# Patient Record
Sex: Male | Born: 1975 | Race: Black or African American | Hispanic: No | Smoking: Current every day smoker
Health system: Southern US, Community
[De-identification: ages and names within clinical notes are randomized; demographics above are authoritative.]

## PROBLEM LIST (undated history)

## (undated) ENCOUNTER — Emergency Department (HOSPITAL_COMMUNITY): Admission: EM | Source: Home / Self Care

## (undated) DIAGNOSIS — F329 Major depressive disorder, single episode, unspecified: Secondary | ICD-10-CM

## (undated) DIAGNOSIS — G4730 Sleep apnea, unspecified: Secondary | ICD-10-CM

## (undated) DIAGNOSIS — E119 Type 2 diabetes mellitus without complications: Secondary | ICD-10-CM

## (undated) DIAGNOSIS — I4891 Unspecified atrial fibrillation: Principal | ICD-10-CM

## (undated) DIAGNOSIS — F32A Depression, unspecified: Secondary | ICD-10-CM

## (undated) DIAGNOSIS — I10 Essential (primary) hypertension: Secondary | ICD-10-CM

## (undated) DIAGNOSIS — F319 Bipolar disorder, unspecified: Secondary | ICD-10-CM

## (undated) HISTORY — PX: CARDIOVERSION: SHX1299

---

## 2012-09-15 ENCOUNTER — Inpatient Hospital Stay (HOSPITAL_COMMUNITY)
Admission: AD | Admit: 2012-09-15 | Discharge: 2012-09-18 | DRG: 885 | Disposition: A | Discharge status: Home / Self Care | Payer: 59 | Source: Intra-hospital | Attending: Psychiatry | Admitting: Psychiatry

## 2012-09-15 ENCOUNTER — Encounter (HOSPITAL_COMMUNITY): Payer: Self-pay | Admitting: *Deleted

## 2012-09-15 ENCOUNTER — Emergency Department (HOSPITAL_COMMUNITY)
Admission: EM | Admit: 2012-09-15 | Discharge: 2012-09-15 | Disposition: A | Discharge status: Other Acute Inpatient Hospital | Payer: Self-pay | Attending: Emergency Medicine | Admitting: Emergency Medicine

## 2012-09-15 ENCOUNTER — Encounter (HOSPITAL_COMMUNITY): Payer: Self-pay

## 2012-09-15 DIAGNOSIS — F1910 Other psychoactive substance abuse, uncomplicated: Secondary | ICD-10-CM

## 2012-09-15 DIAGNOSIS — F329 Major depressive disorder, single episode, unspecified: Secondary | ICD-10-CM

## 2012-09-15 DIAGNOSIS — I10 Essential (primary) hypertension: Secondary | ICD-10-CM

## 2012-09-15 DIAGNOSIS — F319 Bipolar disorder, unspecified: Principal | ICD-10-CM | POA: Diagnosis present

## 2012-09-15 DIAGNOSIS — R45851 Suicidal ideations: Secondary | ICD-10-CM | POA: Insufficient documentation

## 2012-09-15 DIAGNOSIS — Z79899 Other long term (current) drug therapy: Secondary | ICD-10-CM

## 2012-09-15 DIAGNOSIS — F1010 Alcohol abuse, uncomplicated: Secondary | ICD-10-CM | POA: Insufficient documentation

## 2012-09-15 HISTORY — DX: Bipolar disorder, unspecified: F31.9

## 2012-09-15 HISTORY — DX: Depression, unspecified: F32.A

## 2012-09-15 HISTORY — DX: Major depressive disorder, single episode, unspecified: F32.9

## 2012-09-15 HISTORY — DX: Essential (primary) hypertension: I10

## 2012-09-15 LAB — BASIC METABOLIC PANEL
CO2: 29 mEq/L (ref 19–32)
Calcium: 9.1 mg/dL (ref 8.4–10.5)
Creatinine, Ser: 1.85 mg/dL — ABNORMAL HIGH (ref 0.50–1.35)
Glucose, Bld: 195 mg/dL — ABNORMAL HIGH (ref 70–99)

## 2012-09-15 LAB — CBC WITH DIFFERENTIAL/PLATELET
Basophils Absolute: 0 10*3/uL (ref 0.0–0.1)
Basophils Relative: 0 % (ref 0–1)
Eosinophils Absolute: 0.2 10*3/uL (ref 0.0–0.7)
Eosinophils Relative: 2 % (ref 0–5)
MCH: 29.1 pg (ref 26.0–34.0)
MCHC: 34.3 g/dL (ref 30.0–36.0)
MCV: 84.8 fL (ref 78.0–100.0)
Platelets: 298 10*3/uL (ref 150–400)
RDW: 13.4 % (ref 11.5–15.5)

## 2012-09-15 LAB — RAPID URINE DRUG SCREEN, HOSP PERFORMED: Amphetamines: NOT DETECTED

## 2012-09-15 LAB — GLUCOSE, CAPILLARY: Glucose-Capillary: 140 mg/dL — ABNORMAL HIGH (ref 70–99)

## 2012-09-15 LAB — ETHANOL: Alcohol, Ethyl (B): <11 mg/dL (ref 0–11)

## 2012-09-15 MED ORDER — ACETAMINOPHEN 325 MG PO TABS
650.0000 mg | ORAL_TABLET | Freq: Four times a day (QID) | ORAL | Status: DC | PRN
  Administered 2012-09-16 (×2): 650 mg via ORAL

## 2012-09-15 MED ORDER — FAMOTIDINE 20 MG PO TABS
20.0000 mg | ORAL_TABLET | Freq: Two times a day (BID) | ORAL | Status: DC
  Administered 2012-09-15 – 2012-09-18 (×6): 20 mg via ORAL
  Filled 2012-09-15 (×11): qty 1

## 2012-09-15 MED ORDER — TRIAMTERENE-HCTZ 37.5-25 MG PO TABS
1.0000 | ORAL_TABLET | Freq: Every day | ORAL | Status: DC
  Administered 2012-09-15 – 2012-09-18 (×4): 1 via ORAL
  Filled 2012-09-15 (×5): qty 1
  Filled 2012-09-15: qty 3
  Filled 2012-09-15: qty 1

## 2012-09-15 MED ORDER — ASPIRIN EC 81 MG PO TBEC
81.0000 mg | DELAYED_RELEASE_TABLET | Freq: Every day | ORAL | Status: DC
  Administered 2012-09-16 – 2012-09-18 (×3): 81 mg via ORAL
  Filled 2012-09-15 (×5): qty 1

## 2012-09-15 MED ORDER — TRAZODONE HCL 50 MG PO TABS
50.0000 mg | ORAL_TABLET | Freq: Every evening | ORAL | Status: DC | PRN
  Administered 2012-09-15 – 2012-09-17 (×4): 50 mg via ORAL
  Filled 2012-09-15 (×3): qty 1
  Filled 2012-09-15: qty 28
  Filled 2012-09-15 (×3): qty 1
  Filled 2012-09-15: qty 28
  Filled 2012-09-15 (×4): qty 1

## 2012-09-15 MED ORDER — POTASSIUM CHLORIDE CRYS ER 20 MEQ PO TBCR
40.0000 meq | EXTENDED_RELEASE_TABLET | Freq: Once | ORAL | Status: AC
  Administered 2012-09-15: 40 meq via ORAL
  Filled 2012-09-15 (×2): qty 2

## 2012-09-15 MED ORDER — CLONIDINE HCL 0.1 MG PO TABS: 0.1000 mg | ORAL_TABLET | Freq: Once | ORAL | Status: AC

## 2012-09-15 MED ORDER — CLONIDINE HCL 0.1 MG PO TABS
0.1000 mg | ORAL_TABLET | Freq: Three times a day (TID) | ORAL | Status: DC
  Administered 2012-09-16 – 2012-09-18 (×8): 0.1 mg via ORAL
  Filled 2012-09-15 (×6): qty 1
  Filled 2012-09-15: qty 42
  Filled 2012-09-15: qty 1
  Filled 2012-09-15 (×2): qty 42
  Filled 2012-09-15 (×3): qty 1

## 2012-09-15 MED ORDER — POTASSIUM CHLORIDE CRYS ER 20 MEQ PO TBCR
20.0000 meq | EXTENDED_RELEASE_TABLET | Freq: Two times a day (BID) | ORAL | Status: AC
  Administered 2012-09-15 – 2012-09-17 (×4): 20 meq via ORAL
  Filled 2012-09-15 (×5): qty 1

## 2012-09-15 MED ORDER — MAGNESIUM HYDROXIDE 400 MG/5ML PO SUSP: 30.0000 mL | Freq: Every day | ORAL | Status: DC | PRN

## 2012-09-15 MED ORDER — CLONIDINE HCL 0.1 MG PO TABS
ORAL_TABLET | ORAL | Status: AC
  Administered 2012-09-15: 0.1 mg via ORAL
  Filled 2012-09-15: qty 1

## 2012-09-15 MED ORDER — ALUM & MAG HYDROXIDE-SIMETH 200-200-20 MG/5ML PO SUSP: 30.0000 mL | ORAL | Status: DC | PRN

## 2012-09-15 NOTE — ED Notes (Signed)
Per Altmar Sink from [facility] - pt has been accepted to [facility] by Dr. Elsie Saas, dx Mood disorder.  Rm # [id number].  Edp notified.

## 2012-09-15 NOTE — ED Notes (Signed)
Called for Telepsych consult Appt.  Time is 1515.  Nurse informed.

## 2012-09-15 NOTE — ED Provider Notes (Signed)
CSN: [id number]     Arrival date & time 09/15/12  4098 History  This chart was scribed for Donnetta Hutching, MD, by Yevette Edwards, ED Scribe. This patient was seen in room APA16A/APA16A and the patient's care was started at 11:06 AM.   First MD Initiated Contact with Patient 09/15/12 0950     Chief Complaint  Patient presents with  . V70.1   (Consider location/radiation/quality/duration/timing/severity/associated sxs/prior Treatment) The history is provided by the patient. No language interpreter was used.   HPI Comments: Christopher Chambers is a 37 y.o. male, with a h/o bipolar disorder and depression, who presents to the Emergency Department complaining of suicidal ideations which have persisted for several days. He states, "I want to kill myself," and he reports having a suicide plan of taking pills. He states that "he does not feel safe by himself." Yesterday, the pt used cocaine, pain pills, and alcohol. The pt has a h/o suicide ideations, and he stopped taking his medication three weeks ago because he did not like how the medication made him feel. He has one prior hospitalization in [place] for depression.   The pt uses [facility] for his mental health care. His last appointment was several weeks ago.  The pt is currently unemployed.  No past medical history on file. No past surgical history on file. No family history on file. History  Substance Use Topics  . Smoking status: Not on file  . Smokeless tobacco: Not on file  . Alcohol Use: Not on file    Review of Systems  Constitutional: Negative for fever and chills.  Psychiatric/Behavioral: Positive for suicidal ideas.  All other systems reviewed and are negative.    Allergies  Review of patient's allergies indicates not on file.  Home Medications  No current outpatient prescriptions on file.  Triage Vitals: Pulse 79  Temp(Src) 98.2 F (36.8 C)  Resp 18  Ht 6\' 2"  (1.88 m)  Wt 350 lb (158.759 kg)  BMI 44.92 kg/m2  SpO2  98%  Physical Exam  Nursing note and vitals reviewed. Constitutional: He is oriented to person, place, and time. He appears well-developed and well-nourished. No distress.  HENT:  Head: Normocephalic and atraumatic.  Eyes: EOM are normal.  Neck: Neck supple. No tracheal deviation present.  Cardiovascular: Normal rate.   Pulmonary/Chest: Effort normal and breath sounds normal. No respiratory distress.  Musculoskeletal: Normal range of motion.  Neurological: He is oriented to person, place, and time.  Skin: Skin is warm and dry.  Psychiatric:  Flat affect. Depressed.     ED Course   DIAGNOSTIC STUDIES:  Oxygen Saturation is 98% on room air, normal by my interpretation.    COORDINATION OF CARE:  9:56 AM- Discussed treatment plan with patient, and the patient agreed to the plan.   Procedures (including critical care time)  Labs Reviewed  BASIC METABOLIC PANEL - Abnormal; Notable for the following:    Potassium 2.7 (*)    Glucose, Bld 195 (*)    Creatinine, Ser 1.85 (*)    GFR calc non Af Amer 45 (*)    GFR calc Af Amer 52 (*)    All other components within normal limits  CBC WITH DIFFERENTIAL - Abnormal; Notable for the following:    WBC 11.3 (*)    Neutro Abs 8.4 (*)    All other components within normal limits  URINE RAPID DRUG SCREEN (HOSP PERFORMED) - Abnormal; Notable for the following:    Opiates POSITIVE (*)    Cocaine POSITIVE (*)  Tetrahydrocannabinol POSITIVE (*)    All other components within normal limits  ETHANOL   No results found. No diagnosis found.  MDM  Patient has depression with suicidal ruminations.  Drug screen positive for opiates, cocaine, THC.    Behavioral health consultation.  I personally performed the services described in this documentation, which was scribed in my presence. The recorded information has been reviewed and is accurate.    Donnetta Hutching, MD 09/15/12 1434

## 2012-09-15 NOTE — Progress Notes (Signed)
Patient in bed during this assessment. Mood and affect appropriate. He appeared sad and depressed. Although he denied SI/HI and denied hallucinations. Writer encouraged and supported patient. Notified AC concerning patient's weight and asked him to order bariatric bed for patient. AC Eric to call and place an order for Bariatric bed to be brought in tomorrow since patient came in after business hour. Q 15 minute check continues as ordered to maintain safety.
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
## 2012-09-15 NOTE — ED Notes (Addendum)
[facility] AC notified of pt blood pressure upon transport requesting medication for BP prior to transport.m EDP notified and orders received. Receiving RN notified as well.

## 2012-09-15 NOTE — Tx Team (Signed)
Initial Interdisciplinary Treatment Plan  PATIENT STRENGTHS: (choose at least two) General fund of knowledge  PATIENT STRESSORS: Legal issue Substance abuse   PROBLEM LIST: Problem List/Patient Goals Date to be addressed Date deferred Reason deferred Estimated date of resolution  Depression 09/15/12     Risk Suicide 09/15/12     SI 09/15/12     Medication Non-compliance 09/15/12                                    DISCHARGE CRITERIA:  Improved stabilization in mood, thinking, and/or behavior Verbal commitment to aftercare and medication compliance  PRELIMINARY DISCHARGE PLAN: Attend aftercare/continuing care group Attend PHP/IOP  PATIENT/FAMIILY INVOLVEMENT: This treatment plan has been presented to and reviewed with the patient, Christopher Chambers.  The patient and family have been given the opportunity to ask questions and make suggestions.  Jacques Navy A 09/15/2012, 8:22 PM

## 2012-09-15 NOTE — ED Notes (Signed)
Called [facility] for  Transport to [facility].  Spoke with Maisie Fus.  "They'll send a truck when one is available".  Pts nurse informed.

## 2012-09-15 NOTE — ED Notes (Signed)
CRITICAL VALUE ALERT  Critical value received:  K 2.7  Date of notification:  09/15/2012  Time of notification:  1149  Critical value read back:yes  Nurse who received alert:  [facility]  MD notified (1st page):  Dr. Adriana Simas  Time of first page:  1149  MD notified (2nd page):  Time of second page:  Responding MD:  Dr. Adriana Simas Time MD responded: 1149
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
## 2012-09-15 NOTE — ED Notes (Signed)
Pt presents to er with request for help for SI thoughts, pt state "I want to kill myself" admits to having a plan of taking pills, reports that he stopped taking his medication three weeks ago because he did not like the way it made him feel.

## 2012-09-16 DIAGNOSIS — F1994 Other psychoactive substance use, unspecified with psychoactive substance-induced mood disorder: Secondary | ICD-10-CM

## 2012-09-16 DIAGNOSIS — F1920 Other psychoactive substance dependence, uncomplicated: Secondary | ICD-10-CM

## 2012-09-16 DIAGNOSIS — I10 Essential (primary) hypertension: Secondary | ICD-10-CM | POA: Insufficient documentation

## 2012-09-16 DIAGNOSIS — F1910 Other psychoactive substance abuse, uncomplicated: Secondary | ICD-10-CM

## 2012-09-16 DIAGNOSIS — F319 Bipolar disorder, unspecified: Secondary | ICD-10-CM | POA: Diagnosis present

## 2012-09-16 DIAGNOSIS — F3130 Bipolar disorder, current episode depressed, mild or moderate severity, unspecified: Secondary | ICD-10-CM

## 2012-09-16 DIAGNOSIS — F329 Major depressive disorder, single episode, unspecified: Secondary | ICD-10-CM | POA: Insufficient documentation

## 2012-09-16 LAB — BASIC METABOLIC PANEL
CO2: 27 mEq/L (ref 19–32)
Calcium: 8.9 mg/dL (ref 8.4–10.5)
Creatinine, Ser: 1.71 mg/dL — ABNORMAL HIGH (ref 0.50–1.35)
Glucose, Bld: 137 mg/dL — ABNORMAL HIGH (ref 70–99)

## 2012-09-16 LAB — LIPID PANEL
HDL: 31 mg/dL — ABNORMAL LOW (ref >39)
Total CHOL/HDL Ratio: 4.5 RATIO

## 2012-09-16 LAB — TSH: TSH: 0.928 u[IU]/mL (ref 0.350–4.500)
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
## 2012-09-16 NOTE — Progress Notes (Signed)
D:Pt interacting on the unit with peers. Pt c/o headache today and was given prn tylenol. A:Offered support, encouragement and 15 minute checks. R:Pt denies si and hi. Pt denies hallucinations. Safety maintained on the unit.

## 2012-09-16 NOTE — Progress Notes (Signed)
Adult Psychoeducational Group Note  Date:  09/16/2012 Time:  1:58 PM  Group Topic/Focus:  Crisis Planning:   The purpose of this group is to help patients create a crisis plan for use upon discharge or in the future, as needed.  Participation Level:  Did Not Attend  Additional Comments:  Pt refused to attend group.   Dalia Heading 09/16/2012, 1:58 PM

## 2012-09-16 NOTE — Progress Notes (Signed)
D: Patient in the dayroom interacting with peers.  Patient states he is ready to go home.  Patient complained of a mild headache today but states it had decreased.  Patient animated and interacting with peers.  Patient states he is ready to be discharged because patient states he has things he needs to to on the weekend.  Patient states he had stopped taking his medications for weeks and states he is concerned that he needs to be taking the medications he was prescribed from [facility].   Patient denies SI/HI and denies AVH.   A: Staff to monitor Q 15 mins for safety.  Encouragement and support offered.  Scheduled medications administered per orders.   R: Patient remains safe on the unit.  Patient did not attend group tonight but was in the dayroom before group and left before it started and came back for snack time.  Patient visible on the unit and interacting with peers.
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
## 2012-09-16 NOTE — Progress Notes (Signed)
Adult Psychoeducational Group Note  Date:  09/16/2012 Time:  10:00AM Group Topic/Focus:  Therapeutic Activity  Participation Level:  Did Not Attend    Additional Comments: Pt. Didn't attend group.   Bing Plume D 09/16/2012, 10:45 AM

## 2012-09-16 NOTE — Progress Notes (Signed)
Recreation Therapy Notes  Date: 08.13.2014 Time: 3:00pm Location: [address] Dayroom  Group Topic: Communication, Team Building, Problem Solving  Goal Area(s) Addresses:  Patient will be able to recognize use of communication, team building and problem solving during course of group activity. Patient will verbalize need for communication, team building and problem solving to make group activity successful.  Patient will verbalize benefit of communication, team building and problem solving to post d/c goals.   Behavioral Response: Did not attend  Jearl Klinefelter, LRT/CTRS  Jearl Klinefelter 09/16/2012 4:57 PM

## 2012-09-16 NOTE — [facility] Group Notes (Signed)
[facility] LCSW Aftercare Discharge Planning Group Note   09/16/2012 8:45 AM  Participation Quality:  Did Not Attend  [name], LCSWA 09/16/2012 11:41 AM   

## 2012-09-16 NOTE — [facility] Group Notes (Signed)
[facility] LCSW Group Therapy  09/16/2012  1:15 PM   Type of Therapy:  Group Therapy  Participation Level:  Active  Participation Quality:  Appropriate and Attentive  Affect:  Appropriate, Depressed and Blunted  Cognitive:  Alert and Appropriate  Insight:  Developing/Improving and Engaged  Engagement in Therapy:  Developing/Improving and Engaged  Modes of Intervention:  Clarification, Confrontation, Discussion, Education, Exploration, Limit-setting, Orientation, Problem-solving, Rapport Building, Dance movement psychotherapist, Socialization and Support  Summary of Progress/Problems: The topic for group today was emotional regulation.  This group focused on both positive and negative emotion identification and allowed group members to process ways to identify feelings, regulate negative emotions, and find healthy ways to manage internal/external emotions. Group members were asked to reflect on a time when their reaction to an emotion led to a negative outcome and explored how alternative responses using emotion regulation would have benefited them. Group members were also asked to discuss a time when emotion regulation was utilized when a negative emotion was experienced. Pt did not participate in group discussion and would stare at other pts.  Pt reports having a headache and would listen to group today.   Reyes Ivan, LCSWA 09/16/2012 2:42 PM

## 2012-09-16 NOTE — Tx Team (Signed)
Interdisciplinary Treatment Plan Update (Adult)  Date: 09/16/2012  Time Reviewed:  9:45 AM  Progress in Treatment: Attending groups: Yes Participating in groups:  Yes Taking medication as prescribed:  Yes Tolerating medication:  Yes Family/Significant othe contact made: CSW assessing  Patient understands diagnosis:  Yes Discussing patient identified problems/goals with staff:  Yes Medical problems stabilized or resolved:  Yes Denies suicidal/homicidal ideation: Yes Issues/concerns per patient self-inventory:  Yes Other:  New problem(s) identified: N/A  Discharge Plan or Barriers: CSW assessing for appropriate referrals.  Reason for Continuation of Hospitalization: Anxiety Depression Medication Stabilization  Comments: N/A  Estimated length of stay: 3-5 days  For review of initial/current patient goals, please see plan of care.  Attendees: Patient:     Family:     Physician:  Dr. Javier Glazier 09/16/2012 9:56 AM   Nursing:   Harold Barban, RN 09/16/2012 9:56 AM   Clinical Social Worker:  Reyes Ivan, LCSWA 09/16/2012 9:56 AM   Other: Verne Spurr, PA 09/16/2012 9:56 AM   Other:  Frankey Shown, MA care coordination 09/16/2012 9:56 AM   Other:  Juline Patch, LCSW 09/16/2012 9:56 AM   Other:  Chinita Greenland, RN 09/16/2012 9:56 AM  Other:    Other:    Other:    Other:    Other:    Other:     Scribe for Treatment Team:   Carmina Miller, 09/16/2012 9:56 AM

## 2012-09-17 LAB — URINALYSIS, ROUTINE W REFLEX MICROSCOPIC
Glucose, UA: NEGATIVE mg/dL
Ketones, ur: NEGATIVE mg/dL
Leukocytes, UA: NEGATIVE
Protein, ur: 100 mg/dL — AB

## 2012-09-17 LAB — URINE MICROSCOPIC-ADD ON

## 2012-09-17 NOTE — Progress Notes (Signed)
Adult Psychoeducational Group Note  Date:  09/17/2012 Time:  3:22 PM  Group Topic/Focus:  Overcoming Stress:   The focus of this group is to define stress and help patients assess their triggers.  Participation Level:  Did Not Attend  Participation Quality:  Did not attend  Affect:    Cognitive:    Insight: None  Engagement in Group:  None  Modes of Intervention:   Additional Comments:    Reynolds Bowl 09/17/2012, 3:22 PM

## 2012-09-17 NOTE — Progress Notes (Signed)
Patient ID: Christopher Chambers, male   DOB: [birth year], 37 y.o.   MRN: [id number] Pt attended the tx activity Exploring your childhood play. Pt shared when he was a child he came from a large family and they enjoyed playing kickball, biking and fishing. Also shooting his BB gun. When asked what he would like to do as an adult he said he would love to skydive. Sleepy affect, somewhat engaged with group.

## 2012-09-17 NOTE — Progress Notes (Signed)
Recreation Therapy Notes  Date: 08.14.2014 Time: 2:45pm Location: [address] Dayroom   Group Topic: Animal Assisted Activities  Goal Area(s) Addresses:  Patient will interact appropriately with dog team.    Behavioral Response: Appropriate.   Education: Engineer, petroleum.   Education Outcome: Acknowledges understanding  Clinical Observations/Feedback: Dog Team: Runner, broadcasting/film/video. Patient interacted appropriately with peer, dog team, LRT and MHT.    Marykay Lex Asherah Lavoy, LRT/CTRS  Jearl Klinefelter 09/17/2012 7:24 PM

## 2012-09-17 NOTE — Progress Notes (Signed)
Adult Psychoeducational Group Note  Date:  09/17/2012 Time:  11:32 PM  Group Topic/Focus:  Wrap-Up Group:   The focus of this group is to help patients review their daily goal of treatment and discuss progress on daily workbooks.  Participation Level:  Active  Participation Quality:  Appropriate  Affect:  Appropriate  Cognitive:  Appropriate  Insight: Appropriate  Engagement in Group:  Engaged and Improving  Modes of Intervention:  Activity  Additional Comments:  Pt. Attended Christopher Chambers, Christopher Chambers 09/17/2012, 11:32 PM

## 2012-09-17 NOTE — [facility] Suicide Risk Assessment (Signed)
[facility] INPATIENT:  Family/Significant Other Suicide Prevention Education  Suicide Prevention Education:  Education Completed; Christopher Chambers - wife [phone]),  (name of family member/significant other) has been identified by the patient as the family member/significant other with whom the patient will be residing, and identified as the person(s) who will aid the patient in the event of a mental health crisis (suicidal ideations/suicide attempt).  With written consent from the patient, the family member/significant other has been provided the following suicide prevention education, prior to the and/or following the discharge of the patient.  The suicide prevention education provided includes the following:  Suicide risk factors  Suicide prevention and interventions  National Suicide Hotline telephone number  [facility] assessment telephone number  [facility] Emergency Assistance 911  [facility] and/or Residential Mobile Crisis Unit telephone number  Request made of family/significant other to:  Remove weapons (e.g., guns, rifles, knives), all items previously/currently identified as safety concern.    Remove drugs/medications (over-the-counter, prescriptions, illicit drugs), all items previously/currently identified as a safety concern.  The family member/significant other verbalizes understanding of the suicide prevention education information provided.  The family member/significant other agrees to remove the items of safety concern listed above. Christopher Chambers states that she is concerned about Christopher Chambers's at home medications, Trilafon and Lexapro, because it had adverse side affects (making Christopher Chambers's legs twitch/restless and sleepy).  Christopher Chambers states that Christopher Chambers stopped taking his medication because of these side affects.  Christopher Chambers wanted to make sure Christopher Chambers gets on a medication that has minimal side affects so he stays on them upon d/c.    Christopher Chambers 09/17/2012, 9:56 AM

## 2012-09-17 NOTE — [facility] Group Notes (Signed)
[facility] LCSW Group Therapy  [facility] 1:15 - 2:30 PM  09/17/2012   Type of Therapy:  Group Therapy  Participation Level:  Active  Participation Quality:  Attentive  Affect:  Appropriate  Cognitive:  Appropriate  Insight:  Developing/Improving and Engaged  Engagement in Therapy:  Developing/Improving Engaged  Modes of Intervention:  Discussion, Education, Exploration, Problem-Solving, Rapport Building, Support   Summary of Progress/Problems:  Patient listened attentively to speaker from [employer] [facility].   He asked speaker if his wife could attend programs with him.  Speaker advised patient of family/friends group and encouraged both he and his wife to take advantage of their services.  Tyrrell, Stephens 09/17/2012

## 2012-09-17 NOTE — Progress Notes (Signed)
[facility] MD Progress Note  09/17/2012 3:56 PM Shamari Pohle  MRN:  [id number] Subjective:  "I'm doing well today, and I slept a lot yesterday but not as well last night, because I slept all day."  Objective: Patient is alert and oriented, appears well rested notes that he has decreasing SI and no HI.He reports good response to being back on his medication and feels that he is ready to be discharged tomorrow. Diagnosis: Polysubstance abuse   ADL's:  Intact  Sleep: Poor  Appetite:  Good  Suicidal Ideation:  Decreasing, no plan, no intent Homicidal Ideation:  denies AEB (as evidenced by):  Psychiatric Specialty Exam: Review of Systems  Constitutional: Negative.  Negative for fever, chills, weight loss, malaise/fatigue and diaphoresis.  HENT: Negative for congestion and sore throat.   Eyes: Negative for blurred vision, double vision and photophobia.  Respiratory: Negative for cough, shortness of breath and wheezing.   Cardiovascular: Negative for chest pain, palpitations and PND.  Gastrointestinal: Negative for heartburn, nausea, vomiting, abdominal pain, diarrhea and constipation.  Musculoskeletal: Negative for myalgias, joint pain and falls.  Neurological: Negative for dizziness, tingling, tremors, sensory change, speech change, focal weakness, seizures, loss of consciousness, weakness and headaches.  Endo/Heme/Allergies: Negative for polydipsia. Does not bruise/bleed easily.  Psychiatric/Behavioral: Negative for depression, suicidal ideas, hallucinations, memory loss and substance abuse. The patient is not nervous/anxious and does not have insomnia.     Blood pressure 140/110, pulse 72, temperature 98.4 F (36.9 C), temperature source Oral, resp. rate 18, height 6\' 2"  (1.88 m), weight 157.398 kg (347 lb).Body mass index is 44.53 kg/(m^2).  General Appearance: Fairly Groomed  Patent attorney::  Fair  Speech:  Clear and Coherent  Volume:  Normal  Mood:  Anxious and Depressed  Affect:   Congruent  Thought Process:  Goal Directed  Orientation:  Full (Time, Place, and Person)  Thought Content:  WDL  Suicidal Thoughts:  Yes.  without intent/plan  Homicidal Thoughts:  No  Memory:  Immediate;   Poor  Judgement:  Impaired  Insight:  Lacking  Psychomotor Activity:  Normal  Concentration:  Poor  Recall:  Poor  Akathisia:  No  Handed:  Right  AIMS (if indicated):     Assets:  Communication Skills Desire for Improvement Housing  Sleep:  Number of Hours: 6.75   Current Medications: Current Facility-Administered Medications  Medication Dose Route Frequency Provider Last Rate Last Dose  . acetaminophen (TYLENOL) tablet 650 mg  650 mg Oral Q6H PRN Kerry Hough, PA-C   650 mg at 09/16/12 1459  . alum & mag hydroxide-simeth (MAALOX/MYLANTA) 200-200-20 MG/5ML suspension 30 mL  30 mL Oral Q4H PRN Kerry Hough, PA-C      . aspirin EC tablet 81 mg  81 mg Oral Daily Kerry Hough, PA-C   81 mg at 09/17/12 7829  . cloNIDine (CATAPRES) tablet 0.1 mg  0.1 mg Oral TID Kerry Hough, PA-C   0.1 mg at 09/17/12 1207  . famotidine (PEPCID) tablet 20 mg  20 mg Oral BID Kerry Hough, PA-C   20 mg at 09/17/12 0810  . magnesium hydroxide (MILK OF MAGNESIA) suspension 30 mL  30 mL Oral Daily PRN Kerry Hough, PA-C      . traZODone (DESYREL) tablet 50 mg  50 mg Oral QHS,MR X 1 Kerry Hough, PA-C   50 mg at 09/16/12 2216  . triamterene-hydrochlorothiazide (MAXZIDE-25) 37.5-25 MG per tablet 1 tablet  1 tablet Oral Daily Kerry Hough, PA-C  1 tablet at 09/17/12 1610    Lab Results:  Results for orders placed during the hospital encounter of 09/15/12 (from the past 48 hour(s))  GLUCOSE, CAPILLARY     Status: Abnormal   Collection Time    09/15/12  8:05 PM      Result Value Range   Glucose-Capillary 140 (*) 70 - 99 mg/dL  HEMOGLOBIN R6E     Status: Abnormal   Collection Time    09/16/12  6:20 AM      Result Value Range   Hemoglobin A1C 5.8 (*) <5.7 %   Comment: (NOTE)                                                                                According to the ADA Clinical Practice Recommendations for 2011, when     HbA1c is used as a screening test:      >=6.5%   Diagnostic of Diabetes Mellitus               (if abnormal result is confirmed)     5.7-6.4%   Increased risk of developing Diabetes Mellitus     References:Diagnosis and Classification of Diabetes Mellitus,Diabetes     Care,2011,34(Suppl 1):S62-S69 and Standards of Medical Care in             Diabetes - 2011,Diabetes Care,2011,34 (Suppl 1):S11-S61.   Mean Plasma Glucose 120 (*) <117 mg/dL   Comment: Performed at [facility]  TSH     Status: None   Collection Time    09/16/12  6:20 AM      Result Value Range   TSH 0.928  0.350 - 4.500 uIU/mL   Comment: Performed at [facility]  LIPID PANEL     Status: Abnormal   Collection Time    09/16/12  6:20 AM      Result Value Range   Cholesterol 140  0 - 200 mg/dL   Triglycerides 454  <098 mg/dL   HDL 31 (*) >11 mg/dL   Total CHOL/HDL Ratio 4.5     VLDL 27  0 - 40 mg/dL   LDL Cholesterol 82  0 - 99 mg/dL   Comment:            Total Cholesterol/HDL:CHD Risk     Coronary Heart Disease Risk Table                         Men   Women      1/2 Average Risk   3.4   3.3      Average Risk       5.0   4.4      2 X Average Risk   9.6   7.1      3 X Average Risk  23.4   11.0                Use the calculated Patient Ratio     above and the CHD Risk Table     to determine the patient's CHD Risk.                ATP III CLASSIFICATION (LDL):      <  100     mg/dL   Optimal      161-096  mg/dL   Near or Above                        Optimal      130-159  mg/dL   Borderline      045-409  mg/dL   High      >811     mg/dL   Very High     Performed at [facility]  BASIC METABOLIC PANEL     Status: Abnormal   Collection Time    09/16/12  6:20 AM      Result Value Range   Sodium 134 (*) 135 - 145 mEq/L   Potassium 3.4 (*) 3.5 -  5.1 mEq/L   Chloride 98  96 - 112 mEq/L   CO2 27  19 - 32 mEq/L   Glucose, Bld 137 (*) 70 - 99 mg/dL   BUN 16  6 - 23 mg/dL   Creatinine, Ser 9.14 (*) 0.50 - 1.35 mg/dL   Calcium 8.9  8.4 - 78.2 mg/dL   GFR calc non Af Amer 49 (*) >90 mL/min   GFR calc Af Amer 57 (*) >90 mL/min   Comment:            The eGFR has been calculated     using the CKD EPI equation.     This calculation has not been     validated in all clinical     situations.     eGFR's persistently     <90 mL/min signify     possible Chronic Kidney Disease.     Performed at [facility]  URINALYSIS, ROUTINE W REFLEX MICROSCOPIC     Status: Abnormal   Collection Time    09/16/12  7:29 AM      Result Value Range   Color, Urine YELLOW  YELLOW   APPearance CLOUDY (*) CLEAR   Specific Gravity, Urine 1.022  1.005 - 1.030   pH 6.0  5.0 - 8.0   Glucose, UA NEGATIVE  NEGATIVE mg/dL   Hgb urine dipstick NEGATIVE  NEGATIVE   Bilirubin Urine NEGATIVE  NEGATIVE   Ketones, ur NEGATIVE  NEGATIVE mg/dL   Protein, ur 956 (*) NEGATIVE mg/dL   Urobilinogen, UA 1.0  0.0 - 1.0 mg/dL   Nitrite NEGATIVE  NEGATIVE   Leukocytes, UA NEGATIVE  NEGATIVE   Comment: Performed at [facility]  URINE MICROSCOPIC-ADD ON     Status: Abnormal   Collection Time    09/16/12  7:29 AM      Result Value Range   Squamous Epithelial / LPF FEW (*) RARE   Comment: Performed at [facility]    Physical Findings: AIMS: Facial and Oral Movements Muscles of Facial Expression: None, normal Lips and Perioral Area: None, normal Jaw: None, normal Tongue: None, normal,Extremity Movements Upper (arms, wrists, hands, fingers): None, normal Lower (legs, knees, ankles, toes): None, normal, Trunk Movements Neck, shoulders, hips: None, normal, Overall Severity Severity of abnormal movements (highest score from questions above): None, normal Incapacitation due to abnormal movements: None, normal Patient's  awareness of abnormal movements (rate only patient's report): No Awareness, Dental Status Current problems with teeth and/or dentures?: No Does patient usually wear dentures?: No  CIWA:  CIWA-Ar Total: 0 COWS:  COWS Total Score: 1  Treatment Plan Summary: Daily contact with patient to assess and evaluate symptoms  and progress in treatment Medication management  Plan: 1. Continue crisis management and stabilization. 2. Medication management to reduce current symptoms to base line and improve patient's overall level of functioning 3. Treat health problems as indicated. 4. Develop treatment plan to decrease risk of relapse upon discharge and the need for     readmission. 5. Psycho-social education regarding relapse prevention and self care. 6. Health care follow up as needed for medical problems. 7. Continue home medications where appropriate. 8, ELOS: D/C out tomorrow if continues to do well. Patient plans to follow up at [facility] in [place].  Medical Decision Making Problem Points:  Established problem, stable/improving (1) Data Points:  Review of medication regiment & side effects (2)  I certify that inpatient services furnished can reasonably be expected to improve the patient's condition.  Rona Ravens. Mashburn RPAC 4:00 PM 09/17/2012  Reviewed the information documented and agree with the treatment plan.  Montrey Buist,JANARDHAHA R. 09/18/2012 8:42 PM

## 2012-09-17 NOTE — Progress Notes (Signed)
D: Patient appropriate and cooperative with staff. Patient's affect/mood is flat, blunted and depressed. He reported on the self inventory sheet that his sleep is poor, appetite and ability to pay attention are both good and energy level is normal. Patient rated depression "5" and feelings of hopelessness "3". He's attending and participating in groups and compliant with medication regimen.  A: Support and encouragement provided to patient. Scheduled medications administered per MD orders. Maintain Q15 minute checks for safety.  R: Patient receptive. Denies SI/HI/AVH. Patient remains safe.

## 2012-09-17 NOTE — Progress Notes (Signed)
Adult Psychoeducational Group Note  Date:  09/17/2012 Time:  9:00 AM  Group Topic/Focus:  Self Esteem Action Plan:   The focus of this group is to help patients create a plan to continue to build self-esteem after discharge.  Participation Level:  Minimal  Participation Quality:  Appropriate  Affect:  Flat  Cognitive:  Alert and Oriented  Insight: Appropriate  Engagement in Group:  Engaged  Modes of Intervention:  Discussion  Additional Comments: Pt. shared with the group that he has to learn to love himself in order to begin having positive self esteem.  Harold Barban E 09/17/2012, 9:50 AM

## 2012-09-18 MED ORDER — TRAZODONE HCL 50 MG PO TABS: 50.0000 mg | ORAL_TABLET | Freq: Every evening | ORAL | Status: DC | PRN

## 2012-09-18 MED ORDER — TRIAMTERENE-HCTZ 37.5-25 MG PO TABS: 1.0000 | ORAL_TABLET | Freq: Every day | ORAL | Status: DC

## 2012-09-18 MED ORDER — CLONIDINE HCL 0.1 MG PO TABS: 0.1000 mg | ORAL_TABLET | Freq: Three times a day (TID) | ORAL | Status: DC

## 2012-09-18 NOTE — [facility] Suicide Risk Assessment (Signed)
Suicide Risk Assessment  Discharge Assessment     Demographic Factors:  Male, Adolescent or young adult, Low socioeconomic status and Unemployed  Mental Status Per Nursing Assessment::   On Admission:  NA  Current Mental Status by Physician: Mental Status Examination: Patient appeared as per his stated age, casually dressed, and fairly groomed, and maintaining good eye contact. Patient has good mood and his affect was constricted. He has normal rate, rhythm, and volume of speech. His thought process is linear and goal directed. Patient has denied suicidal, homicidal ideations, intentions or plans. Patient has no evidence of auditory or visual hallucinations, delusions, and paranoia. Patient has fair insight judgment and impulse control.  Loss Factors: Decrease in vocational status and Financial problems/change in socioeconomic status  Historical Factors: Prior suicide attempts and Impulsivity  Risk Reduction Factors:   Sense of responsibility to family, Religious beliefs about death, Living with another person, especially a relative, Positive social support, Positive therapeutic relationship and Positive coping skills or problem solving skills  Continued Clinical Symptoms:  Depression:   Impulsivity Recent sense of peace/wellbeing Alcohol/Substance Abuse/Dependencies Previous Psychiatric Diagnoses and Treatments  Cognitive Features That Contribute To Risk:  Polarized thinking    Suicide Risk:  Minimal: No identifiable suicidal ideation.  Patients presenting with no risk factors but with morbid ruminations; may be classified as minimal risk based on the severity of the depressive symptoms  Discharge Diagnoses:   AXIS I:  Substance Induced Mood Disorder and Polysubstance abuse AXIS II:  Deferred AXIS III:   Past Medical History  Diagnosis Date  . Bipolar disorder   . Depression   . Hypertension    AXIS IV:  economic problems, occupational problems, other psychosocial or  environmental problems, problems related to social environment and problems with primary support group AXIS V:  61-70 mild symptoms  Plan Of Care/Follow-up recommendations:  Activity:  As tolerated Diet:  Regular  Is patient on multiple antipsychotic therapies at discharge:  No   Has Patient had three or more failed trials of antipsychotic monotherapy by history:  No  Recommended Plan for Multiple Antipsychotic Therapies: Not applicable  Christopher Chambers., M.D. 09/18/2012, 11:51 AM

## 2012-09-18 NOTE — [facility] Group Notes (Signed)
[facility] LCSW Aftercare Discharge Planning Group Note   09/18/2012 8:45 AM  Participation Quality:  Alert and Appropriate   Mood/Affect:  Appropriate and Calm  Depression Rating:  1  Anxiety Rating:  1  Thoughts of Suicide:  Pt denies SI/HI  Will you contract for safety?   Yes  Current AVH: Pt denies  Plan for Discharge/Comments:  Pt attended discharge planning group and actively participated in group.  CSW provided pt with today's workbook.  Pt reports feeling great today and ready to d/c.  Pt states that he plans to stay on his meds, keep up with his follow up and find a job.  Pt will return home in [place].  Pt will follow up with Ar[facility] Medici for medication management and therapy.  No further needs voiced by pt at this time.    Transportation Means: Pt has access to transportation  Supports: No supports mentioned at this time  Reyes Ivan, LCSWA 09/18/2012 10:02 AM

## 2012-09-18 NOTE — Progress Notes (Signed)
Patient ID: Christopher Chambers, male   DOB: [birth year], 37 y.o.   MRN: [id number]  D: Took over patient's care @ 2330. Patient in bed sleeping. Respiration regular and unlabored. No sign of distress noted at this time A: 15 mins checks for safety. R: Patient  is safe.

## 2012-09-18 NOTE — Progress Notes (Signed)
Adult Psychoeducational Group Note  Date:  09/18/2012 Time:  5:11 PM  Group Topic/Focus:  Relapse Prevention Planning:   The focus of this group is to define relapse and discuss the need for planning to combat relapse.  Participation Level:  Active  Participation Quality:  Appropriate, Sharing and Supportive  Affect:  Appropriate  Cognitive:  Appropriate  Insight: Appropriate  Engagement in Group:  Distracting and Engaged  Modes of Intervention:  Discussion, Education, Socialization and Support  Additional Comments:  Pt stated he wanted to work on having more healthy thought processes in order to help prevent relapse.  Caswell Corwin 09/18/2012, 5:11 PM

## 2012-09-18 NOTE — Discharge Summary (Signed)
Physician Discharge Summary Note  Patient:  Christopher Chambers is an 37 y.o., male MRN:  [id number] DOB:  [birth year] Patient phone:  [phone] (home)  Patient address:   [address] [place] [address],   Date of Admission:  09/15/2012 Date of Discharge: 09/18/2012  Reason for Admission:  Depression with polysubstance abuse and suicidal ideation  Discharge Diagnoses: Principal Problem:   Polysubstance abuse Active Problems:   Bipolar disorder  Review of Systems  Constitutional: Negative.  Negative for fever, chills, weight loss, malaise/fatigue and diaphoresis.  HENT: Negative for congestion and sore throat.   Eyes: Negative for blurred vision, double vision and photophobia.  Respiratory: Negative for cough, shortness of breath and wheezing.   Cardiovascular: Negative for chest pain, palpitations and PND.  Gastrointestinal: Negative for heartburn, nausea, vomiting, abdominal pain, diarrhea and constipation.  Musculoskeletal: Negative for myalgias, joint pain and falls.  Neurological: Negative for dizziness, tingling, tremors, sensory change, speech change, focal weakness, seizures, loss of consciousness, weakness and headaches.  Endo/Heme/Allergies: Negative for polydipsia. Does not bruise/bleed easily.  Psychiatric/Behavioral: Negative for depression, suicidal ideas, hallucinations, memory loss and substance abuse. The patient is not nervous/anxious and does not have insomnia.    Discharge Diagnoses:  AXIS I: Substance Induced Mood Disorder and Polysubstance abuse  AXIS II: Deferred  AXIS III:  Past Medical History   Diagnosis  Date   .  Bipolar disorder    .  Depression    .  Hypertension     AXIS IV: economic problems, occupational problems, other psychosocial or environmental problems, problems related to social environment and problems with primary support group  AXIS V: 61-70 mild symptoms  Level of Care:  OP  Hospital Course:         [place] was admitted to  the unit after he presented to the [facility] reporting suicidal ideation and, auditory command hallucinations, homicidal ideations, and delusional thinking. He had been non compliant with his medication and abusing marijuana, cocaine, and opiates.  He was felt to be in need of acute psychiatric hospitalization and stabilization for crisis management.         He was transferred to [facility] and evaluated. The symptoms were identified as depression, guilt, self pity, anger, irritability, isolation, fatigue and hopelessness and helplessness.  The patient was oriented to the unit and encouraged to participate in unit programming. Medical problems were identified and treated appropriately. Home medication was restarted as needed. Psychiatric medication management was initiated.      He was evaluated each day by a clinical provider to ascertain the patient's response to treatment.  Improvement was noted by the patient's report of decreasing symptoms, improved sleep and appetite, affect, medication tolerance, behavior, and participation in unit programming.  He was asked each day to complete a self inventory noting mood, mental status, pain, new symptoms, anxiety and concerns.        The patient responded well to medication and being in a therapeutic and supportive environment. Positive and appropriate behavior was noted and the patient was motivated for recovery. He worked closely with the treatment team and case manager to develop a discharge plan with appropriate goals. Coping skills, problem solving as well as relaxation therapies were also part of the unit programming.         By the day of discharge the patient was in much improved condition than upon admission.  Symptoms were reported as significantly decreased or resolved completely.  The patient denied SI/HI and voiced no AVH. He  was motivated to continue taking medication with a goal of continued improvement in mental health.  He was discharged home with a plan to  follow up as noted below.  Consults:  None  Significant Diagnostic Studies:  None  Discharge Vitals:   Blood pressure 148/65, pulse 72, temperature 98.2 F (36.8 C), temperature source Oral, resp. rate 20, height 6\' 2"  (1.88 m), weight 157.398 kg (347 lb). Body mass index is 44.53 kg/(m^2). Lab Results:   Results for orders placed during the hospital encounter of 09/15/12 (from the past 72 hour(s))  GLUCOSE, CAPILLARY     Status: Abnormal   Collection Time    09/15/12  8:05 PM      Result Value Range   Glucose-Capillary 140 (*) 70 - 99 mg/dL  HEMOGLOBIN R6E     Status: Abnormal   Collection Time    09/16/12  6:20 AM      Result Value Range   Hemoglobin A1C 5.8 (*) <5.7 %   Comment: (NOTE)                                                                               According to the ADA Clinical Practice Recommendations for 2011, when     HbA1c is used as a screening test:      >=6.5%   Diagnostic of Diabetes Mellitus               (if abnormal result is confirmed)     5.7-6.4%   Increased risk of developing Diabetes Mellitus     References:Diagnosis and Classification of Diabetes Mellitus,Diabetes     Care,2011,34(Suppl 1):S62-S69 and Standards of Medical Care in             Diabetes - 2011,Diabetes Care,2011,34 (Suppl 1):S11-S61.   Mean Plasma Glucose 120 (*) <117 mg/dL   Comment: Performed at [facility]  TSH     Status: None   Collection Time    09/16/12  6:20 AM      Result Value Range   TSH 0.928  0.350 - 4.500 uIU/mL   Comment: Performed at [facility]  LIPID PANEL     Status: Abnormal   Collection Time    09/16/12  6:20 AM      Result Value Range   Cholesterol 140  0 - 200 mg/dL   Triglycerides 454  <098 mg/dL   HDL 31 (*) >11 mg/dL   Total CHOL/HDL Ratio 4.5     VLDL 27  0 - 40 mg/dL   LDL Cholesterol 82  0 - 99 mg/dL   Comment:            Total Cholesterol/HDL:CHD Risk     Coronary Heart Disease Risk Table                         Men    Women      1/2 Average Risk   3.4   3.3      Average Risk       5.0   4.4      2 X Average Risk   9.6   7.1  3 X Average Risk  23.4   11.0                Use the calculated Patient Ratio     above and the CHD Risk Table     to determine the patient's CHD Risk.                ATP III CLASSIFICATION (LDL):      <100     mg/dL   Optimal      161-096  mg/dL   Near or Above                        Optimal      130-159  mg/dL   Borderline      045-409  mg/dL   High      >811     mg/dL   Very High     Performed at [facility]  BASIC METABOLIC PANEL     Status: Abnormal   Collection Time    09/16/12  6:20 AM      Result Value Range   Sodium 134 (*) 135 - 145 mEq/L   Potassium 3.4 (*) 3.5 - 5.1 mEq/L   Chloride 98  96 - 112 mEq/L   CO2 27  19 - 32 mEq/L   Glucose, Bld 137 (*) 70 - 99 mg/dL   BUN 16  6 - 23 mg/dL   Creatinine, Ser 9.14 (*) 0.50 - 1.35 mg/dL   Calcium 8.9  8.4 - 78.2 mg/dL   GFR calc non Af Amer 49 (*) >90 mL/min   GFR calc Af Amer 57 (*) >90 mL/min   Comment:            The eGFR has been calculated     using the CKD EPI equation.     This calculation has not been     validated in all clinical     situations.     eGFR's persistently     <90 mL/min signify     possible Chronic Kidney Disease.     Performed at [facility]  URINALYSIS, ROUTINE W REFLEX MICROSCOPIC     Status: Abnormal   Collection Time    09/16/12  7:29 AM      Result Value Range   Color, Urine YELLOW  YELLOW   APPearance CLOUDY (*) CLEAR   Specific Gravity, Urine 1.022  1.005 - 1.030   pH 6.0  5.0 - 8.0   Glucose, UA NEGATIVE  NEGATIVE mg/dL   Hgb urine dipstick NEGATIVE  NEGATIVE   Bilirubin Urine NEGATIVE  NEGATIVE   Ketones, ur NEGATIVE  NEGATIVE mg/dL   Protein, ur 956 (*) NEGATIVE mg/dL   Urobilinogen, UA 1.0  0.0 - 1.0 mg/dL   Nitrite NEGATIVE  NEGATIVE   Leukocytes, UA NEGATIVE  NEGATIVE   Comment: Performed at [facility]  URINE  MICROSCOPIC-ADD ON     Status: Abnormal   Collection Time    09/16/12  7:29 AM      Result Value Range   Squamous Epithelial / LPF FEW (*) RARE   Comment: Performed at [facility]    Physical Findings: AIMS: Facial and Oral Movements Muscles of Facial Expression: None, normal Lips and Perioral Area: None, normal Jaw: None, normal Tongue: None, normal,Extremity Movements Upper (arms, wrists, hands, fingers): None, normal Lower (legs, knees, ankles, toes): None, normal, Trunk Movements Neck, shoulders, hips: None,  normal, Overall Severity Severity of abnormal movements (highest score from questions above): None, normal Incapacitation due to abnormal movements: None, normal Patient's awareness of abnormal movements (rate only patient's report): No Awareness, Dental Status Current problems with teeth and/or dentures?: No Does patient usually wear dentures?: No  CIWA:  CIWA-Ar Total: 0 COWS:  COWS Total Score: 1  Psychiatric Specialty Exam: See Psychiatric Specialty Exam and Suicide Risk Assessment completed by Attending Physician prior to discharge.  Discharge destination:  Home  Is patient on multiple antipsychotic therapies at discharge:  No   Has Patient had three or more failed trials of antipsychotic monotherapy by history:  No  Recommended Plan for Multiple Antipsychotic Therapies: NA  Discharge Orders   Future Orders Complete By Expires   Diet - low sodium heart healthy  As directed    Discharge instructions  As directed    Comments:     Take all of your medications as directed. Be sure to keep all of your follow up appointments.  If you are unable to keep your follow up appointment, call your Doctor's office to let them know, and reschedule.  Make sure that you have enough medication to last until your appointment. Be sure to get plenty of rest. Going to bed at the same time each night will help. Try to avoid sleeping during the day.  Increase your  activity as tolerated. Regular exercise will help you to sleep better and improve your mental health. Eating a heart healthy diet is recommended. Try to avoid salty or fried foods. Be sure to avoid all alcohol and illegal drugs.   Increase activity slowly  As directed        Medication List       Indication   cloNIDine 0.1 MG tablet  Commonly known as:  CATAPRES  Take 1 tablet (0.1 mg total) by mouth 3 (three) times daily.   Indication:  High Blood Pressure     traZODone 50 MG tablet  Commonly known as:  DESYREL  Take 1 tablet (50 mg total) by mouth at bedtime and may repeat dose one time if needed.   Indication:  Trouble Sleeping     triamterene-hydrochlorothiazide 37.5-25 MG per tablet  Commonly known as:  MAXZIDE-25  Take 1 tablet by mouth daily.   Indication:  High Blood Pressure           Follow-up Information   Follow up with Arna Medici.   Contact information:   Physical Address: [address] [place], [place] [address]  Mailing Address:  [address] [place], [place] [address] Phone: [phone] Fax: [phone]      Follow-up recommendations:   Activities: Resume activity as tolerated. Diet: Heart healthy low sodium diet Tests: Follow up testing will be determined by your out patient provider.  Comments:    Total Discharge Time:  Less than 30 minutes.  Signed: Rona Ravens. [facility] 09/18/2012  Patient examined personally and developed treatment plan. Case discussed with treatment team. Reviewed the information documented and agree with the treatment plan.  Tamarah Bhullar,JANARDHAHA R. 09/22/2012 4:32 PM

## 2012-09-18 NOTE — Tx Team (Signed)
Interdisciplinary Treatment Plan Update (Adult)  Date: 09/18/2012  Time Reviewed:  9:45 AM  Progress in Treatment: Attending groups: Yes Participating in groups:  Yes Taking medication as prescribed:  Yes Tolerating medication:  Yes Family/Significant othe contact made: Yes Patient understands diagnosis:  Yes Discussing patient identified problems/goals with staff:  Yes Medical problems stabilized or resolved:  Yes Denies suicidal/homicidal ideation: Yes Issues/concerns per patient self-inventory:  Yes Other:  New problem(s) identified: N/A  Discharge Plan or Barriers: Pt will follow up at [facility] for medication management and therapy.    Reason for Continuation of Hospitalization: Stable to d/c  Comments: N/A  Estimated length of stay: D/C today  For review of initial/current patient goals, please see plan of care.  Attendees: Patient: Christopher Chambers  09/18/2012 9:52 AM   Family:     Physician:  Dr. Javier Glazier 09/18/2012 9:40 AM   Nursing:   Lamount Cranker, RN 09/18/2012 9:40 AM   Clinical Social Worker:  Reyes Ivan, LCSWA 09/18/2012 9:40 AM   Other: Verne Spurr, PA 09/18/2012 9:40 AM   Other:  Frankey Shown, MA care coordination 09/18/2012 9:40 AM   Other:  Juline Patch, LCSW 09/18/2012 9:40 AM   Other:  Harold Barban, RN 09/18/2012 9:40 AM  Other:    Other:    Other:    Other:    Other:    Other:     Scribe for Treatment Team:   Carmina Miller, 09/18/2012 9:40 AM

## 2012-09-18 NOTE — Progress Notes (Signed)
Adult Psychoeducational Group Note  Date:  09/18/2012 Time:  11:37 AM  Group Topic/Focus:  Therapeutic Activity  Participation Level:  Did Not Attend  Christopher Chambers 09/18/2012, 11:37 AM

## 2012-09-18 NOTE — Progress Notes (Signed)
D/C instructions/meds/follow-up appointments reviewed, pt verbalized understanding, pt's belongings returned to pt, samples given. 

## 2012-09-18 NOTE — Progress Notes (Signed)
[facility] Adult Case Management Discharge Plan :  Will you be returning to the same living situation after discharge: Yes,  returning home At discharge, do you have transportation home?:Yes,  family will pick pt up Do you have the ability to pay for your medications:Yes,  access to meds  Release of information consent forms completed and in the chart;  Patient's signature needed at discharge.  Patient to Follow up at: Follow-up Information   Follow up with Arna Medici On 09/22/2012. (Appointment scheduled at 8:30 am for hospital discharge appointment.  Referral # [id number])    Contact information:   Physical Address: [address] [place], [place] [address]  Mailing Address:  [address] [place], [place] [address] Phone: [phone] Fax: [phone]      Patient denies SI/HI:   Yes,  denies SI/HI    Safety Planning and Suicide Prevention discussed:  Yes,  discussed with pt and pt's wife.  See suicide prevention education note.    Carmina Miller 09/18/2012, 12:46 PM

## 2012-09-21 NOTE — Progress Notes (Signed)
Patient Discharge Instructions:  After Visit Summary (AVS):   Faxed to:  09/21/12 Discharge Summary Note:   Faxed to:  09/21/12 Psychiatric Admission Assessment Note:   Faxed to:  09/21/12 Suicide Risk Assessment - Discharge Assessment:   Faxed to:  09/21/12  Tonna Corner, 09/21/2012, 5:26 PM  Faxed to [facility] @ [phone]

## 2013-10-06 ENCOUNTER — Encounter (HOSPITAL_COMMUNITY): Payer: Self-pay | Admitting: Emergency Medicine

## 2013-10-06 ENCOUNTER — Inpatient Hospital Stay (HOSPITAL_COMMUNITY)
Admission: EM | Admit: 2013-10-06 | Discharge: 2013-10-08 | DRG: 309 | Disposition: A | Discharge status: Home / Self Care | Payer: Self-pay | Attending: Family Medicine | Admitting: Family Medicine

## 2013-10-06 ENCOUNTER — Emergency Department (HOSPITAL_COMMUNITY): Payer: Self-pay

## 2013-10-06 DIAGNOSIS — G4730 Sleep apnea, unspecified: Secondary | ICD-10-CM | POA: Diagnosis present

## 2013-10-06 DIAGNOSIS — I10 Essential (primary) hypertension: Secondary | ICD-10-CM | POA: Diagnosis present

## 2013-10-06 DIAGNOSIS — Z91199 Patient's noncompliance with other medical treatment and regimen due to unspecified reason: Secondary | ICD-10-CM

## 2013-10-06 DIAGNOSIS — E119 Type 2 diabetes mellitus without complications: Secondary | ICD-10-CM | POA: Diagnosis present

## 2013-10-06 DIAGNOSIS — D72829 Elevated white blood cell count, unspecified: Secondary | ICD-10-CM | POA: Diagnosis present

## 2013-10-06 DIAGNOSIS — N183 Chronic kidney disease, stage 3 unspecified: Secondary | ICD-10-CM | POA: Diagnosis present

## 2013-10-06 DIAGNOSIS — N179 Acute kidney failure, unspecified: Secondary | ICD-10-CM | POA: Diagnosis present

## 2013-10-06 DIAGNOSIS — I129 Hypertensive chronic kidney disease with stage 1 through stage 4 chronic kidney disease, or unspecified chronic kidney disease: Secondary | ICD-10-CM | POA: Diagnosis present

## 2013-10-06 DIAGNOSIS — Z8249 Family history of ischemic heart disease and other diseases of the circulatory system: Secondary | ICD-10-CM

## 2013-10-06 DIAGNOSIS — F17200 Nicotine dependence, unspecified, uncomplicated: Secondary | ICD-10-CM | POA: Diagnosis present

## 2013-10-06 DIAGNOSIS — Z9119 Patient's noncompliance with other medical treatment and regimen: Secondary | ICD-10-CM

## 2013-10-06 DIAGNOSIS — G4733 Obstructive sleep apnea (adult) (pediatric): Secondary | ICD-10-CM | POA: Diagnosis present

## 2013-10-06 DIAGNOSIS — I4891 Unspecified atrial fibrillation: Principal | ICD-10-CM | POA: Diagnosis present

## 2013-10-06 DIAGNOSIS — E876 Hypokalemia: Secondary | ICD-10-CM | POA: Diagnosis present

## 2013-10-06 DIAGNOSIS — F319 Bipolar disorder, unspecified: Secondary | ICD-10-CM | POA: Diagnosis present

## 2013-10-06 DIAGNOSIS — Z6841 Body Mass Index (BMI) 40.0 and over, adult: Secondary | ICD-10-CM

## 2013-10-06 DIAGNOSIS — F3131 Bipolar disorder, current episode depressed, mild: Secondary | ICD-10-CM

## 2013-10-06 HISTORY — DX: Unspecified atrial fibrillation: I48.91

## 2013-10-06 LAB — CBC WITH DIFFERENTIAL/PLATELET
Basophils Absolute: 0.1 10*3/uL (ref 0.0–0.1)
Basophils Relative: 0 % (ref 0–1)
EOS ABS: 0.4 10*3/uL (ref 0.0–0.7)
Eosinophils Relative: 3 % (ref 0–5)
HCT: 39.5 % (ref 39.0–52.0)
HEMOGLOBIN: 13.9 g/dL (ref 13.0–17.0)
LYMPHS ABS: 3.4 10*3/uL (ref 0.7–4.0)
LYMPHS PCT: 21 % (ref 12–46)
MCH: 29.3 pg (ref 26.0–34.0)
MCHC: 35.2 g/dL (ref 30.0–36.0)
MCV: 83.2 fL (ref 78.0–100.0)
MONOS PCT: 8 % (ref 3–12)
Monocytes Absolute: 1.3 10*3/uL — ABNORMAL HIGH (ref 0.1–1.0)
NEUTROS ABS: 11.1 10*3/uL — AB (ref 1.7–7.7)
NEUTROS PCT: 68 % (ref 43–77)
PLATELETS: 284 10*3/uL (ref 150–400)
RBC: 4.75 MIL/uL (ref 4.22–5.81)
RDW: 13.3 % (ref 11.5–15.5)
WBC: 16.3 10*3/uL — AB (ref 4.0–10.5)

## 2013-10-06 LAB — I-STAT TROPONIN, ED: TROPONIN I, POC: 0 ng/mL (ref 0.00–0.08)

## 2013-10-06 LAB — BASIC METABOLIC PANEL
ANION GAP: 12 (ref 5–15)
BUN: 25 mg/dL — ABNORMAL HIGH (ref 6–23)
CHLORIDE: 100 meq/L (ref 96–112)
CO2: 29 mEq/L (ref 19–32)
Calcium: 8.8 mg/dL (ref 8.4–10.5)
Creatinine, Ser: 2.36 mg/dL — ABNORMAL HIGH (ref 0.50–1.35)
GFR, EST AFRICAN AMERICAN: 38 mL/min — AB (ref >90)
GFR, EST NON AFRICAN AMERICAN: 33 mL/min — AB (ref >90)
Glucose, Bld: 140 mg/dL — ABNORMAL HIGH (ref 70–99)
POTASSIUM: 3.2 meq/L — AB (ref 3.7–5.3)
SODIUM: 141 meq/L (ref 137–147)

## 2013-10-06 MED ORDER — METOPROLOL TARTRATE 1 MG/ML IV SOLN
INTRAVENOUS | Status: AC
  Filled 2013-10-06: qty 5

## 2013-10-06 MED ORDER — METOPROLOL TARTRATE 1 MG/ML IV SOLN
5.0000 mg | Freq: Once | INTRAVENOUS | Status: AC
  Administered 2013-10-06: 5 mg via INTRAVENOUS
  Filled 2013-10-06: qty 5

## 2013-10-06 NOTE — ED Notes (Signed)
Patient states that he started having chest pain with heart flutetrs around 4pm today

## 2013-10-06 NOTE — ED Provider Notes (Signed)
CSN: [id number]     Arrival date & time 10/06/13  2243 History  This chart was scribed for Christopher Gaskins, MD by Bronson Curb, ED Scribe. This patient was seen in room APA09/APA09 and the patient's care was started at 11:12 PM.     Chief Complaint  Patient presents with  . Chest Pain      Patient is a 38 y.o. male presenting with chest pain. The history is provided by the patient. No language interpreter was used.  Chest Pain Pain quality: tightness   Pain radiates to:  Does not radiate Pain radiates to the back: no   Pain severity:  Moderate Timing:  Constant Progression:  Improving Chronicity:  New Relieved by:  None tried Worsened by:  Nothing tried Ineffective treatments:  None tried Associated symptoms: abdominal pain, diaphoresis, palpitations and shortness of breath   Associated symptoms: no fever, no syncope and not vomiting   Risk factors: hypertension   Risk factors: no coronary artery disease, no diabetes mellitus and no prior DVT/PE     HPI Comments: Christopher Chambers is a 38 y.o. male who presents to the Emergency Department complaining of sudden onset CP that began at approximately 1600 today. Patient states his heart "began to flutter" prior to experiencing CP. There is associated SOB and chest tightness. He denies LOC, nausea, emesis, or diarrhea. Patient reports history of similar symtpoms and states this was related to high blood pressure.  He reports he has been on IV drip before "high heart rate" but is unsure what his diagnosis was at that time.  Patient has history of HTN, depression, and bipolar disorder. He denies history of CAD, DM, or DVT/PE. He denies pleuritic pain  Past Medical History  Diagnosis Date  . Bipolar disorder   . Depression   . Hypertension    History reviewed. No pertinent past surgical history. History reviewed. No pertinent family history. History  Substance Use Topics  . Smoking status: Current Every Day Smoker -- 1.00  packs/day    Types: Cigarettes  . Smokeless tobacco: Not on file  . Alcohol Use: Yes    Review of Systems  Constitutional: Positive for diaphoresis. Negative for fever.  Respiratory: Positive for shortness of breath.   Cardiovascular: Positive for chest pain and palpitations. Negative for syncope.  Gastrointestinal: Positive for abdominal pain. Negative for vomiting.  Neurological: Negative for syncope.  All other systems reviewed and are negative.     Allergies  Review of patient's allergies indicates no known allergies.  Home Medications   Prior to Admission medications   Medication Sig Start Date End Date Taking? Authorizing Provider  cloNIDine (CATAPRES) 0.1 MG tablet Take 1 tablet (0.1 mg total) by mouth 3 (three) times daily. 09/18/12   Verne Spurr, PA-C  traZODone (DESYREL) 50 MG tablet Take 1 tablet (50 mg total) by mouth at bedtime and may repeat dose one time if needed. 09/18/12   Verne Spurr, PA-C  triamterene-hydrochlorothiazide (MAXZIDE-25) 37.5-25 MG per tablet Take 1 tablet by mouth daily. 09/18/12   Verne Spurr, PA-C   Triage Vitals: BP 145/102  Pulse 143  Resp 18  Ht  (1.88 m)  Wt 355 lb (161.027 kg)  BMI 45.56 kg/m2  SpO2 97%  Physical Exam CONSTITUTIONAL: Well developed/well nourished HEAD: Normocephalic/atraumatic EYES: EOMI/PERRL ENMT: Mucous membranes moist NECK: supple no meningeal signs SPINE:entire spine nontender CV: Tachycardia LUNGS: Lungs are clear to auscultation bilaterally, no apparent distress ABDOMEN: soft, nontender, no rebound or guarding. He is obese  GU:no cva tenderness NEURO: Pt is awake/alert, moves all extremitiesx4 EXTREMITIES: pulses normal, full ROM SKIN: warm, color normal PSYCH: no abnormalities of mood noted  ED Course  Procedures  CRITICAL CARE Performed by: Christopher Chambers Total critical care time: 31 Critical care time was exclusive of separately billable procedures and treating other  patients. Critical care was necessary to treat or prevent imminent or life-threatening deterioration. Critical care was time spent personally by me on the following activities: development of treatment plan with patient and/or surrogate as well as nursing, discussions with consultants, evaluation of patient's response to treatment, examination of patient, obtaining history from patient or surrogate, ordering and performing treatments and interventions, ordering and review of laboratory studies, ordering and review of radiographic studies, pulse oximetry and re-evaluation of patient's condition. PATIENT REQUIRING IV CARDIZEM DRIP FOR ATRIAL FIBRILLATION WITH RVR WITH HR>140  DIAGNOSTIC STUDIES: Oxygen Saturation is 97% on room air, normal by my interpretation.    COORDINATION OF CARE: At 2317 Discussed treatment plan with patient which includes CXR, labs, and metoprolol injection. Patient agrees.   1:43 AM PT NOTED TO BE IN ATRIAL FIBRILLATION CARDIZEM HAS BEEN STARTED HIS HEART RATE IS STARTING TO IMPROVE WITH IV CARDIZEM WILL NEED ADMISSION D/W DR Theron Arista LE WITH [facility], WILL ADMIT PATIENT AGREEABLE WITH PLAN PT IS ALSO NOTED TO HAVE WORSENING RENAL FAILURE I AM NOT CONVINCED THIS IS NEW ONSET AS HE REPORTS HE HAS BEEN ON IV DRIP PREVIOUSLY FOR TACHYCARDIA  Labs Review Labs Reviewed  BASIC METABOLIC PANEL - Abnormal; Notable for the following:    Potassium 3.2 (*)    Glucose, Bld 140 (*)    BUN 25 (*)    Creatinine, Ser 2.36 (*)    GFR calc non Af Amer 33 (*)    GFR calc Af Amer 38 (*)    All other components within normal limits  CBC WITH DIFFERENTIAL - Abnormal; Notable for the following:    WBC 16.3 (*)    Neutro Abs 11.1 (*)    Monocytes Absolute 1.3 (*)    All other components within normal limits  I-STAT TROPOININ, ED    Imaging Review Dg Chest Portable 1 View  10/06/2013   CLINICAL DATA:  CHEST PAIN CHEST PAIN  EXAM: PORTABLE CHEST - 1 VIEW  COMPARISON:  None available   FINDINGS: Mild cardiomegaly.  Lungs are clear. No effusion.  No pneumothorax. Visualized skeletal structures are unremarkable.  IMPRESSION: 1. Mild cardiomegaly.   Electronically Signed   By: Oley Balm M.D.   On: 10/06/2013 23:52     EKG Interpretation   Date/Time:  Wednesday October 06 2013 22:59:53 EDT Ventricular Rate:  143 PR Interval:  82 QRS Duration: 100 QT Interval:  331 QTC Calculation: 510 R Axis:   37 Text Interpretation:  Sinus tachycardia with irregular rate Nonspecific  repol abnormality, diffuse leads Prolonged QT interval Confirmed by  Bebe Shaggy  MD, Dorinda Hill ([id number]) on 10/06/2013 11:11:15 PM      MDM   Final diagnoses:  Atrial fibrillation with rapid ventricular response  AKI (acute kidney injury)    Nursing notes including past medical history and social history reviewed and considered in documentation xrays reviewed and considered Labs/vital reviewed and considered   I personally performed the services described in this documentation, which was scribed in my presence. The recorded information has been reviewed and is accurate.      Christopher Gaskins, MD 10/07/13 [phone]

## 2013-10-07 ENCOUNTER — Encounter (HOSPITAL_COMMUNITY): Payer: Self-pay | Admitting: Internal Medicine

## 2013-10-07 DIAGNOSIS — I10 Essential (primary) hypertension: Secondary | ICD-10-CM

## 2013-10-07 DIAGNOSIS — F3131 Bipolar disorder, current episode depressed, mild: Secondary | ICD-10-CM

## 2013-10-07 DIAGNOSIS — I4891 Unspecified atrial fibrillation: Secondary | ICD-10-CM | POA: Diagnosis present

## 2013-10-07 DIAGNOSIS — N183 Chronic kidney disease, stage 3 unspecified: Secondary | ICD-10-CM

## 2013-10-07 DIAGNOSIS — G4730 Sleep apnea, unspecified: Secondary | ICD-10-CM | POA: Diagnosis present

## 2013-10-07 LAB — TROPONIN I
Troponin I: <0.3 ng/mL (ref <0.30)
Troponin I: <0.3 ng/mL (ref <0.30)

## 2013-10-07 LAB — TSH: TSH: 1.8 u[IU]/mL (ref 0.350–4.500)

## 2013-10-07 LAB — RAPID URINE DRUG SCREEN, HOSP PERFORMED
Amphetamines: NOT DETECTED
BENZODIAZEPINES: NOT DETECTED
Barbiturates: NOT DETECTED
Cocaine: NOT DETECTED
OPIATES: NOT DETECTED
Tetrahydrocannabinol: NOT DETECTED

## 2013-10-07 LAB — HEPARIN LEVEL (UNFRACTIONATED): Heparin Unfractionated: 0.21 IU/mL — ABNORMAL LOW (ref 0.30–0.70)

## 2013-10-07 LAB — MRSA PCR SCREENING: MRSA BY PCR: NEGATIVE

## 2013-10-07 LAB — MAGNESIUM: Magnesium: 2.1 mg/dL (ref 1.5–2.5)

## 2013-10-07 MED ORDER — DILTIAZEM HCL 30 MG PO TABS
30.0000 mg | ORAL_TABLET | Freq: Four times a day (QID) | ORAL | Status: DC
  Administered 2013-10-07 – 2013-10-08 (×4): 30 mg via ORAL
  Filled 2013-10-07 (×3): qty 1

## 2013-10-07 MED ORDER — ONDANSETRON HCL 4 MG/2ML IJ SOLN: 4.0000 mg | Freq: Four times a day (QID) | INTRAMUSCULAR | Status: DC | PRN

## 2013-10-07 MED ORDER — TRAZODONE HCL 50 MG PO TABS
50.0000 mg | ORAL_TABLET | Freq: Every evening | ORAL | Status: DC | PRN
  Filled 2013-10-07: qty 1

## 2013-10-07 MED ORDER — SODIUM CHLORIDE 0.9 % IJ SOLN: 3.0000 mL | Freq: Two times a day (BID) | INTRAMUSCULAR | Status: DC

## 2013-10-07 MED ORDER — TRIAMTERENE-HCTZ 37.5-25 MG PO TABS: 1.0000 | ORAL_TABLET | Freq: Every day | ORAL | Status: DC

## 2013-10-07 MED ORDER — ASPIRIN EC 325 MG PO TBEC
325.0000 mg | DELAYED_RELEASE_TABLET | Freq: Every day | ORAL | Status: DC
  Administered 2013-10-07 – 2013-10-08 (×2): 325 mg via ORAL
  Filled 2013-10-07 (×2): qty 1

## 2013-10-07 MED ORDER — POTASSIUM CHLORIDE CRYS ER 20 MEQ PO TBCR
40.0000 meq | EXTENDED_RELEASE_TABLET | Freq: Once | ORAL | Status: AC
  Administered 2013-10-07: 40 meq via ORAL
  Filled 2013-10-07: qty 2

## 2013-10-07 MED ORDER — ONDANSETRON HCL 4 MG PO TABS
4.0000 mg | ORAL_TABLET | Freq: Four times a day (QID) | ORAL | Status: DC | PRN
Start: 2013-10-07 — End: 2013-10-08

## 2013-10-07 MED ORDER — DILTIAZEM HCL 100 MG IV SOLR
5.0000 mg/h | Freq: Once | INTRAVENOUS | Status: AC
  Administered 2013-10-07: 5 mg/h via INTRAVENOUS

## 2013-10-07 MED ORDER — HEPARIN BOLUS VIA INFUSION
4000.0000 [IU] | Freq: Once | INTRAVENOUS | Status: AC
  Administered 2013-10-07: 4000 [IU] via INTRAVENOUS
  Filled 2013-10-07: qty 4000

## 2013-10-07 MED ORDER — CARVEDILOL 3.125 MG PO TABS: 6.2500 mg | ORAL_TABLET | Freq: Two times a day (BID) | ORAL | Status: DC

## 2013-10-07 MED ORDER — DILTIAZEM HCL 100 MG IV SOLR
5.0000 mg/h | INTRAVENOUS | Status: DC
  Administered 2013-10-07: 15 mg/h via INTRAVENOUS
  Filled 2013-10-07: qty 100

## 2013-10-07 MED ORDER — HEPARIN (PORCINE) IN NACL 100-0.45 UNIT/ML-% IJ SOLN
1900.0000 [IU]/h | INTRAMUSCULAR | Status: DC
  Administered 2013-10-07: 1900 [IU]/h via INTRAVENOUS
  Administered 2013-10-07: 1600 [IU]/h via INTRAVENOUS
  Filled 2013-10-07: qty 250

## 2013-10-07 MED ORDER — METOPROLOL TARTRATE 1 MG/ML IV SOLN
5.0000 mg | Freq: Four times a day (QID) | INTRAVENOUS | Status: DC
  Administered 2013-10-07: 5 mg via INTRAVENOUS
  Filled 2013-10-07: qty 5

## 2013-10-07 MED ORDER — DOCUSATE SODIUM 100 MG PO CAPS
100.0000 mg | ORAL_CAPSULE | Freq: Two times a day (BID) | ORAL | Status: DC
Start: 2013-10-07 — End: 2013-10-08
  Administered 2013-10-07 – 2013-10-08 (×3): 100 mg via ORAL
  Filled 2013-10-07 (×3): qty 1

## 2013-10-07 NOTE — Progress Notes (Signed)
UR chart review completed.  

## 2013-10-07 NOTE — Progress Notes (Signed)
ANTICOAGULATION CONSULT NOTE - Initial Consult  Pharmacy Consult for Heparin Indication: atrial fibrillation, R/O MI  No Known Allergies  Patient Measurements: Height:  (188 cm) Weight: 371 lb 14.7 oz (168.7 kg) IBW/kg (Calculated) : 82.2 Heparin Dosing Weight: 130 kg  Vital Signs: Temp: 97.7 F (36.5 C) (09/03 0339) Temp src: Oral (09/03 0339) BP: 108/53 mmHg (09/03 0339) Pulse Rate: 105 (09/03 0339)  Labs:  Recent Labs  10/06/13 2317  HGB 13.9  HCT 39.5  PLT 284  CREATININE 2.36*    Estimated Creatinine Clearance: 69.4 ml/min (by C-G formula based on Cr of 2.36).   Medical History: Past Medical History  Diagnosis Date  . Bipolar disorder   . Depression   . Hypertension     Medications:  Scheduled:   Assessment: 38 yo M admitted with chest pain and new onset Afib.   Initial troponin negative, however EKG changes noted. CHADS2Vasc score =1.  Cardiology consult pending.  No bleeding noted.  CBC reviewed.     Goal of Therapy:  Heparin level 0.3-0.7 units/ml Monitor platelets by anticoagulation protocol: Yes   Plan:  Give 4000 units bolus x 1 Start heparin infusion at 1600 units/hr Check anti-Xa level in 6 hours and daily while on heparin Continue to monitor H&H and platelets F/U cardiology recommendations   Christopher Chambers 10/07/2013,3:46 AM

## 2013-10-07 NOTE — Progress Notes (Signed)
ANTICOAGULATION CONSULT NOTE  Pharmacy Consult for Heparin Indication: atrial fibrillation, R/O MI  No Known Allergies  Patient Measurements: Height:  (188 cm) Weight: 371 lb 14.7 oz (168.7 kg) IBW/kg (Calculated) : 82.2 Heparin Dosing Weight: 130 kg  Vital Signs: Temp: 98.1 F (36.7 C) (09/03 0748) Temp src: Oral (09/03 0748) BP: 154/77 mmHg (09/03 1100) Pulse Rate: 93 (09/03 1130)  Labs:  Recent Labs  10/06/13 2317 10/07/13 0358 10/07/13 1014 10/07/13 1020  HGB 13.9  --   --   --   HCT 39.5  --   --   --   PLT 284  --   --   --   HEPARINUNFRC  --   --  0.21*  --   CREATININE 2.36*  --   --   --   TROPONINI  --  <0.30  --  <0.30    Estimated Creatinine Clearance: 69.4 ml/min (by C-G formula based on Cr of 2.36).   Medical History: Past Medical History  Diagnosis Date  . Bipolar disorder   . Depression   . Hypertension   . Atrial fibrillation     Medications:  Scheduled:   Assessment: 38 yo M admitted with chest pain and new onset Afib.   CHADS2Vasc score =1 based on available data.  Long-term anticoagulation recommendations per cardiology based on pending ECHO results.   No bleeding noted.   Initial heparin level below desired goal range.   Goal of Therapy:  Heparin level 0.3-0.7 units/ml Monitor platelets by anticoagulation protocol: Yes   Plan:  Increase heparin infusion to 1900 units/hr Recheck 6hr heparin level Daily heparin level & CBC while on heparin F/U cardiology recommendations   Elson Clan 10/07/2013,11:54 AM

## 2013-10-07 NOTE — H&P (Signed)
[facility] History and Physical  Christopher Chambers ZOX:[id number] DOB: 1975-07-02    PCP:   No PCP Per Patient   Chief Complaint: chest pain and palpitation  HPI: Christopher Chambers is an 38 y.o. male with hx of morbid obesity, hx of afib diagnosed when he was incarcerated (he doesn't know which hospital he was at), hx of HTN, prior alcohol and drug use, likely hx of sleep apnea, bipolar disorder and depression, presented to the ER with substernal chest pain and palpitation.  He denied nausea, vomiting, fever, chills, or SOB.  In the ER, he was found to be in afib with RVR, and IV cardiazem drip was started to control his HR.  He never had CHF, CVA, or DM.  He has no hx of bleeding ulcer, easy bruisability, black or bloody stool, and no recent surgery.  He has a leukocytosis, and elevated Cr to 2.2, with K of 3.2.  Hospitalist was asked to admit him for afib with RVR.  Rewiew of Systems:  Constitutional: Negative for malaise, fever and chills. No significant weight loss or weight gain Eyes: Negative for eye pain, redness and discharge, diplopia, visual changes, or flashes of light. ENMT: Negative for ear pain, hoarseness, nasal congestion, sinus pressure and sore throat. No headaches; tinnitus, drooling, or problem swallowing. Cardiovascular: Negative for chest pain, palpitations, diaphoresis, dyspnea and peripheral edema. ; No orthopnea, PND Respiratory: Negative for cough, hemoptysis, wheezing and stridor. No pleuritic chestpain. Gastrointestinal: Negative for nausea, vomiting, diarrhea, constipation, abdominal pain, melena, blood in stool, hematemesis, jaundice and rectal bleeding.    Genitourinary: Negative for frequency, dysuria, incontinence,flank pain and hematuria; Musculoskeletal: Negative for back pain and neck pain. Negative for swelling and trauma.;  Skin: . Negative for pruritus, rash, abrasions, bruising and skin lesion.; ulcerations Neuro: Negative for headache,  lightheadedness and neck stiffness. Negative for weakness, altered level of consciousness , altered mental status, extremity weakness, burning feet, involuntary movement, seizure and syncope.  Psych: negative for anxiety, insomnia, tearfulness, panic attacks, hallucinations, paranoia, suicidal or homicidal ideation.   Past Medical History  Diagnosis Date  . Bipolar disorder   . Depression   . Hypertension     History reviewed. No pertinent past surgical history.  Medications:  HOME MEDS: Prior to Admission medications   Medication Sig Start Date End Date Taking? Authorizing Provider  cloNIDine (CATAPRES) 0.1 MG tablet Take 1 tablet (0.1 mg total) by mouth 3 (three) times daily. 09/18/12   Verne Spurr, PA-C  traZODone (DESYREL) 50 MG tablet Take 1 tablet (50 mg total) by mouth at bedtime and may repeat dose one time if needed. 09/18/12   Verne Spurr, PA-C  triamterene-hydrochlorothiazide (MAXZIDE-25) 37.5-25 MG per tablet Take 1 tablet by mouth daily. 09/18/12   Verne Spurr, PA-C     Allergies:  No Known Allergies  Social History:   reports that he has been smoking Cigarettes.  He has been smoking about 1.00 pack per day. He does not have any smokeless tobacco history on file. He reports that he drinks alcohol. He reports that he uses illicit drugs (Cocaine and Marijuana).  Family History: History reviewed. No pertinent family history.   Physical Exam: Filed Vitals:   10/07/13 0034 10/07/13 0100 10/07/13 0130 10/07/13 0233  BP: 129/91 108/89 136/80 126/85  Pulse: 65 139 115 113  Temp:      TempSrc:      Resp: Height:      Weight:      SpO2: 99%  98% 96% 97%   Blood pressure 126/85, pulse 113, temperature 97.6 F (36.4 C), temperature source Oral, resp. rate 18, height  (1.88 m), weight 161.027 kg (355 lb), SpO2 97.00%.  GEN:  Pleasant patient lying in the stretcher in no acute distress; cooperative with exam. PSYCH:  alert and oriented x4; does not  appear anxious or depressed; affect is appropriate. HEENT: Mucous membranes pink and anicteric; PERRLA; EOM intact; no cervical lymphadenopathy nor thyromegaly or carotid bruit; no JVD; There were no stridor. Neck is very supple. Breasts:: Not examined CHEST WALL: No tenderness CHEST: Normal respiration, clear to auscultation bilaterally.  HEART: Irregular, rapid.   There are no murmur, rub, or gallops.   BACK: No kyphosis or scoliosis; no CVA tenderness ABDOMEN: soft and non-tender; no masses, no organomegaly, normal abdominal bowel sounds; no pannus; no intertriginous candida. There is no rebound and no distention. Rectal Exam: Not done EXTREMITIES: No bone or joint deformity; age-appropriate arthropathy of the hands and knees; no edema; no ulcerations.  There is no calf tenderness. He has a Department of [employer] right ankle monitoring device. Genitalia: not examined PULSES: 2+ and symmetric SKIN: Normal hydration no rash or ulceration CNS: Cranial nerves 2-12 grossly intact no focal lateralizing neurologic deficit.  Speech is fluent; uvula elevated with phonation, facial symmetry and tongue midline. DTR are normal bilaterally, cerebella exam is intact, barbinski is negative and strengths are equaled bilaterally.  No sensory loss.   Labs on Admission:  Basic Metabolic Panel:  Recent Labs Lab 10/06/13 2317  NA 141  K 3.2*  CL 100  CO2 29  GLUCOSE 140*  BUN 25*  CREATININE 2.36*  CALCIUM 8.8   Liver Function Tests: No results found for this basename: AST, ALT, ALKPHOS, BILITOT, PROT, ALBUMIN,  in the last 168 hours No results found for this basename: LIPASE, AMYLASE,  in the last 168 hours No results found for this basename: AMMONIA,  in the last 168 hours CBC:  Recent Labs Lab 10/06/13 2317  WBC 16.3*  NEUTROABS 11.1*  HGB 13.9  HCT 39.5  MCV 83.2  PLT 284     Radiological Exams on Admission: Dg Chest Portable 1 View  10/06/2013   CLINICAL DATA:  CHEST PAIN CHEST  PAIN  EXAM: PORTABLE CHEST - 1 VIEW  COMPARISON:  None available  FINDINGS: Mild cardiomegaly.  Lungs are clear. No effusion.  No pneumothorax. Visualized skeletal structures are unremarkable.  IMPRESSION: 1. Mild cardiomegaly.   Electronically Signed   By: Oley Balm M.D.   On: 10/06/2013 23:52    EKG: Independently reviewed. afib with RVR.   Assessment/Plan Present on Admission:  . Atrial fibrillation with rapid ventricular response . Hypertension . Bipolar disorder . Morbid obesity . Sleep apnea . A-fib  PLAN:  Will admit him to the ICU for afib with RVR, and chest pain, r/out.  I am not convinced that he was able to tell his afib started at 4pm today, as he can't tell he is in afib when his rate is controlled.  Will therefore give him IV Heparin, as it may have been longer than 72 hours that he was in afib.  Will continue IV dilt for now, and taper tomorrow while starting oral meds.  I have ordered an ECHO also. Please consult cardiology tomorrow for further recommendation.   He is likely to have sleep apnea, and i have recommended that he be checked out patient.  For his HTN, will continue with his meds.  He  is stable, full code, and will be admitted to [facility] service.    Other plans as per orders.  Code Status: FULL Unk Lightning, MD. [facility] Pager [phone] 7pm to 7am.  10/07/2013, 3:28 AM

## 2013-10-07 NOTE — Consult Note (Signed)
CARDIOLOGY CONSULT NOTE   Patient ID: Christopher Chambers MRN: [id number] DOB/AGE: 09-Nov-1975 38 y.o.  Admit Date: 10/06/2013 Referring Physician: PTH Primary Physician: No PCP Per Patient Consulting Cardiologist: Dina Rich MD Primary Cardiologist: New Reason for Consultation: Atrial fib with RVR  Clinical Summary Mr. Dunavant is a 38 y.o.male who is morbidly obese, with known history of hypertension, polysubstance abuse, OSA, Bipolar Disorder and depression, CKD Stage III, DM, admitted with Afib with RVR. 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  He has had atrial fib in the past and was seen in ER at [facility] twice within the last month for similar episodes. Records are being sent from our [place] office. He was released. He also is a former inmate, and had episodes of atrial fib during incarceration in [place] approx 6-7 years ago. He has been inconsistent with medications, he receives them from [facility], and does not use CPAP as one has not been ordered for him in the past.   On arrival to ER, he was found to be hypertensive BP 145/102, HR 143 bpm. Potassium 3.2, Glucose 140, Creatinine 2.36. WBC 16.3. CXR was negative for CHF or  Pneumonia. He was placed on diltiazem gtt. After bolus of metoprolol 5 mg X 2. He is now also on heparin.   He is feeling better but remain anxious. HR is better controlled on diltiazem at 5 mg/hr.   No Known Allergies  Medications Scheduled Medications: . aspirin EC  325 mg Oral Daily  . carvedilol  6.25 mg Oral BID WC  . diltiazem  30 mg Oral 4 times per day  . docusate sodium  100 mg Oral BID  . metoprolol  5 mg Intravenous 4 times per day  . sodium chloride  3 mL Intravenous Q12H  . traZODone  50 mg Oral QHS,MR X 1    Infusions: . heparin 1,600 Units/hr (10/07/13 1000)    PRN Medications: ondansetron (ZOFRAN) IV,  ondansetron   Past Medical History  Diagnosis Date  . Bipolar disorder   . Depression   . Hypertension   . Atrial fibrillation     History reviewed. No pertinent past surgical history.  Family History  Problem Relation Age of Onset  . Heart failure Maternal Grandmother     Social History Mr. Brentlinger reports that he has been smoking Cigarettes.  He has been smoking about 1.00 pack per day. He does not have any smokeless tobacco history on file. Mr. Garcilazo reports that he drinks alcohol.  Review of Systems Otherwise reviewed and negative except as outlined.  Physical Examination Blood pressure 141/93, pulse 110, temperature 98.1 F (36.7 C), temperature source Oral, resp. rate 20, height  (1.88 m), weight 371 lb 14.7 oz (168.7 kg), SpO2 98.00%.  Intake/Output Summary (Last 24 hours) at 10/07/13 1027 Last data filed at 10/07/13 1000  Gross per 24 hour  Intake 204.17 ml  Output    600 ml  Net -395.83 ml    Telemetry:Atrial fib rates 110-88 bpm.  NFA:OZHYQMV but slightly anxious.  HEENT: Conjunctiva and lids normal, oropharynx clear with moist mucosa. Neck: Supple, no elevated JVP or carotid bruits, no thyromegaly. Lungs: Clear to auscultation, nonlabored breathing at rest. Cardiac: Irregular rate and rhythm, no S3 or significant systolic murmur, no pericardial rub. Abdomen: Soft, nontender, no hepatomegaly obese, bowel sounds present, no guarding or rebound. Extremities:  No pitting edema, distal pulses 2+. Ankle tracker on right ankle per law enforcement. Skin: Warm and dry. Musculoskeletal: No kyphosis. Neuropsychiatric: Alert and oriented x3, affect grossly appropriate.  Prior Cardiac Testing/Procedures  Lab Results  Basic Metabolic Panel:  Recent Labs Lab 10/06/13 2317  NA 141  K 3.2*  CL 100  CO2 29  GLUCOSE 140*  BUN 25*  CREATININE 2.36*  CALCIUM 8.8    Liver Function Tests: No results found for this basename: AST, ALT, ALKPHOS, BILITOT,  PROT, ALBUMIN,  in the last 168 hours  CBC:  Recent Labs Lab 10/06/13 2317  WBC 16.3*  NEUTROABS 11.1*  HGB 13.9  HCT 39.5  MCV 83.2  PLT 284    Cardiac Enzymes:  Recent Labs Lab 10/07/13 0358  TROPONINI <0.30    Radiology: Dg Chest Portable 1 View  10/06/2013   CLINICAL DATA:  CHEST PAIN CHEST PAIN  EXAM: PORTABLE CHEST - 1 VIEW  COMPARISON:  None available  FINDINGS: Mild cardiomegaly.  Lungs are clear. No effusion.  No pneumothorax. Visualized skeletal structures are unremarkable.  IMPRESSION: 1. Mild cardiomegaly.   Electronically Signed   By: Oley Balm M.D.   On: 10/06/2013 23:52     ECG: Atrial fib with RVR rate of 144 bpm   Impression and Recommendations  1. Atrial fib with RVR:  Patient's HR is now better controlled but remains in atrial fib. He is being transitioned to po diltiazem at noon. Review of recent records from [facility] demonstrated positive UDS for cocaine, but patient denies use. He was in NSR on discharge from [place] on 09/06/2013. No cardiac testing was completed. UDS is pending this admission.  He is non-compliant with medications.   Echo is pending. Agree with transition to po. Would not place on anticoagulation with non-compliance, substance abuse. OSA contributing.   2. Hypertension: Patient was hypertensive on admission and is now well controlled on dilt gtt. He is being started on coreg 6.25 mg BID. Caution with cocaine use. Review of home medications as him on Maxzide 37.5/25, discharge medications from [facility] have amlodipine 5 mg and HCTZ 25 mg daily. OSA and obesity contributing. Echo is pending.  3. CKD: Stage III:  Creatinine is elevated. Review of labs from [facility] in August of 2015 show that creatinine was 1.96 on discharge. Admission level 2.36.   4. OSA: Complicating hypertension and contributing to atrial fib. He is now using CPAP. Consider ordering during this admission with assistance to get machine on discharge.    Signed: Bettey Mare. Lawrence NP  10/07/2013, 10:27 AM Co-Sign MD  Patient seen and discussed with NP Lyman Bishop. 38 yo male hx of obesity, afib, HTN, polysubstance abuse, bipolar, depression admitted with palpitations and chest pain. Found to be in afib with RVR in ER, started on dilt gtt. Recent admits with afib with RVR at [facility], noted to be cocaine positive at that time with reported medication non-compliance.   K 3.2 BUN 25  GFR 33 Hgb 13.9 WBC 16.3 Trop neg EKG afib rate 143 CXR mild cardiomegaly  Recent [facility] Admission 09/2013 K 3.3 Cr 1.96 Mg 2.1 TSH 0.928   Afib RVR likely due to medication non-compliance. He is off dilt gtt and now on oral dilt and coreg. Would hold off on coreg at this point, titrate oral dilt as needed for rate control and then consolidate to long acting dilt. F/u echo results, if evidence of LV systolic dysfunction will need to be changed to beta blocker. Follow blood pressures,  may need additional agent. Would probably avoid HCTZ as he has had persistent hypokalemia on admits, could consider maxide as a diuretic due to its K sparing. Biggest issue is medication compliance. We hope to have him establish with [place] as an outpatient, at that time consider outpatient sleep study. Final recs for anticoag vs ASA pending echo, his CHADS2Vasc score is 1 based on available date. Ok to continue heparin for now, would need strong commitment from him for compliance and follow up to dedicate him to anticoagulation. Please keep K at 4 and Mg at 2, I have written for 40 of K today as I don't see he has received any.   Dominga Ferry MD

## 2013-10-07 NOTE — Progress Notes (Addendum)
9:14 AM I agree with HPI/GPe and A/P per Dr. Houston Siren      38 y/o no prior h/o Afib, h/o htn, major depressive disorder admitted with intermittent episodes of tachycardia and feeling heaviness on chest..  Has happened before a couple of times-last episode was about 2 weeks ago while mowing lawn.   Known htn and takes meds-unclear how regularily  HEENT Morbid obesity, Body mass index is 47.73 kg/(m^2). CHEST clear no adde dosund CARDIAC  s1 s 2irreg irreg, Tele shows fib with rate control  Patient Active Problem List   Diagnosis Date Noted  . Atrial fibrillation with rapid ventricular response 10/07/2013  . Morbid obesity 10/07/2013  . Sleep apnea 10/07/2013  . A-fib 10/07/2013  . Polysubstance abuse 09/16/2012  . Bipolar disorder   . Depression   . Hypertension    . diltiazem (CARDIZEM) infusion 15 mg/hr (10/07/13 0900)  . heparin 1,600 Units/hr (10/07/13 0900)   Patient very hungry-will feed at least breakfast Will consult cardiology for recommendations-cycle troponins.  Uncure if anginal equivalent. EKG- first one showed only Rate related changes Await Echo Will consolidate IV cardizem to po cardizem 60 q6 hrs and add BB Cardiology input regarding either DC after echo vs rate control strategy. He has CKD stage 2-3 - but this isnt accurate given his obesity His Htn will need tighter control and i have d.c his Maxzide for Coreg which we can uptitrate to goal for hr control   Pleas Koch, MD [facility] [phone]

## 2013-10-07 NOTE — Care Management Note (Signed)
    Page 1 of 1   10/08/2013     4:48:26 PM CARE MANAGEMENT NOTE 10/08/2013  Patient:  Christopher Chambers, Christopher Chambers   Account Number:  [id number]  Date Initiated:  10/07/2013  Documentation initiated by:  Sharrie Rothman  Subjective/Objective Assessment:   Pt admitted from home with a fib. Pt lives with family and will return home at discharge. Pt is independent with ADL's. Pt stated that he receives care at the [facility] and they have assisted pt with medications in the past.     Action/Plan:   Pt will need MATCH voucher at discharge. No other CM needs noted.   Anticipated DC Date:  10/09/2013   Anticipated DC Plan:  HOME/SELF CARE      DC Planning Services  CM consult      Choice offered to / List presented to:             Status of service:  Completed, signed off Medicare Important Message given?   (If response is "NO", the following Medicare IM given date fields will be blank) Date Medicare IM given:   Medicare IM given by:   Date Additional Medicare IM given:   Additional Medicare IM given by:    Discharge Disposition:  HOME/SELF CARE  Per UR Regulation:  Reviewed for med. necessity/level of care/duration of stay  If discussed at Long Length of Stay Meetings, dates discussed:    Comments:  10/08/13 1100 Anibal Henderson RN/CM MATCH voucher not needed 10/07/13 1440 Arlyss Queen, Charity fundraiser BSN CM

## 2013-10-07 NOTE — ED Provider Notes (Signed)
EKG Interpretation  Date/Time:  Wednesday October 06 2013 23:46:32 EDT Ventricular Rate:  133 PR Interval:  82 QRS Duration: 103 QT Interval:  330 QTC Calculation: 491 R Axis:   46 Text Interpretation:  Atrial fibrillation Nonspecific repol abnormality, diffuse leads no ST changes when compared to prior Confirmed by Bebe Shaggy  MD, Ketty Bitton ([id number]) on 10/06/2013 11:55:39 PM        Joya Gaskins, MD 10/07/13 0145

## 2013-10-07 NOTE — Progress Notes (Deleted)
Pt has had decreased output for the shift (150cc since 0800). MD notified. Bladder scanned, only 43cc urine in bladder. Will continue to monitor.

## 2013-10-07 NOTE — Progress Notes (Signed)
Pt's cardizem gtt stopped at 1300. Converted to sinus brady around 1330. Verified by EKG. Will continue to monitor.

## 2013-10-08 LAB — COMPREHENSIVE METABOLIC PANEL
ALBUMIN: 3 g/dL — AB (ref 3.5–5.2)
ALK PHOS: 107 U/L (ref 39–117)
ALT: 7 U/L (ref 0–53)
AST: 10 U/L (ref 0–37)
Anion gap: 11 (ref 5–15)
BUN: 26 mg/dL — ABNORMAL HIGH (ref 6–23)
CO2: 29 mEq/L (ref 19–32)
Calcium: 8.7 mg/dL (ref 8.4–10.5)
Chloride: 101 mEq/L (ref 96–112)
Creatinine, Ser: 1.81 mg/dL — ABNORMAL HIGH (ref 0.50–1.35)
GFR calc Af Amer: 53 mL/min — ABNORMAL LOW (ref >90)
GFR calc non Af Amer: 45 mL/min — ABNORMAL LOW (ref >90)
Glucose, Bld: 139 mg/dL — ABNORMAL HIGH (ref 70–99)
POTASSIUM: 3.2 meq/L — AB (ref 3.7–5.3)
Sodium: 141 mEq/L (ref 137–147)
TOTAL PROTEIN: 6.5 g/dL (ref 6.0–8.3)
Total Bilirubin: 0.3 mg/dL (ref 0.3–1.2)

## 2013-10-08 LAB — CBC
HEMATOCRIT: 37.4 % — AB (ref 39.0–52.0)
HEMOGLOBIN: 12.8 g/dL — AB (ref 13.0–17.0)
MCH: 28.6 pg (ref 26.0–34.0)
MCHC: 34.2 g/dL (ref 30.0–36.0)
MCV: 83.7 fL (ref 78.0–100.0)
Platelets: 259 10*3/uL (ref 150–400)
RBC: 4.47 MIL/uL (ref 4.22–5.81)
RDW: 13.3 % (ref 11.5–15.5)
WBC: 11 10*3/uL — AB (ref 4.0–10.5)

## 2013-10-08 LAB — MAGNESIUM: MAGNESIUM: 2.1 mg/dL (ref 1.5–2.5)

## 2013-10-08 MED ORDER — POTASSIUM CHLORIDE CRYS ER 20 MEQ PO TBCR
40.0000 meq | EXTENDED_RELEASE_TABLET | ORAL | Status: DC
  Administered 2013-10-08: 40 meq via ORAL
  Filled 2013-10-08: qty 2

## 2013-10-08 MED ORDER — DILTIAZEM HCL ER COATED BEADS 180 MG PO CP24
180.0000 mg | ORAL_CAPSULE | Freq: Every day | ORAL | Status: DC
  Administered 2013-10-08: 180 mg via ORAL
  Filled 2013-10-08: qty 1

## 2013-10-08 MED ORDER — DILTIAZEM HCL ER COATED BEADS 180 MG PO CP24: 180.0000 mg | ORAL_CAPSULE | Freq: Every day | ORAL | Status: DC

## 2013-10-08 MED ORDER — POTASSIUM CHLORIDE CRYS ER 20 MEQ PO TBCR: 40.0000 meq | EXTENDED_RELEASE_TABLET | Freq: Two times a day (BID) | ORAL | Status: DC

## 2013-10-08 MED ORDER — ASPIRIN 325 MG PO TBEC: 325.0000 mg | DELAYED_RELEASE_TABLET | Freq: Every day | ORAL | Status: DC

## 2013-10-08 MED ORDER — CLONIDINE HCL 0.1 MG PO TABS: 0.1000 mg | ORAL_TABLET | Freq: Two times a day (BID) | ORAL | Status: DC

## 2013-10-08 MED ORDER — DILTIAZEM HCL ER COATED BEADS 180 MG PO CP24: 180.0000 mg | ORAL_CAPSULE | Freq: Every day | ORAL | Status: AC

## 2013-10-08 NOTE — Progress Notes (Addendum)
Patient's prescriptions called into wrong pharmacy. Dr. Mahala Menghini notified, will call to pharmacy of patient's preference. Patient's blood pressure elevated, at home patient was on Clonidine 0.1mg . Dr. Mahala Menghini notified.

## 2013-10-08 NOTE — Progress Notes (Signed)
UR review complete.  

## 2013-10-08 NOTE — Discharge Summary (Signed)
Physician Discharge Summary  Christopher Chambers ZOX:[id number] DOB: 04-Jun-1975 DOA: 10/06/2013  PCP: No PCP Per Patient  Admit date: 10/06/2013 Discharge date: 10/08/2013  Time spent: 35 minutes  Recommendations for Outpatient Follow-up:  1. Continue Cardizem 180 SR and consider up titration as OP 2. Repeat BMET in in 1-2 weeks as hypokalemic in hospital 3. Needs OP counseling for weight loss  Discharge Diagnoses:  Principal Problem:   Atrial fibrillation with rapid ventricular response Active Problems:   Bipolar disorder   Hypertension   Morbid obesity   Sleep apnea   A-fib   Discharge Condition: fair  Diet recommendation: heart healthy low salt  Filed Weights   10/06/13 2302 10/07/13 0339  Weight: 161.027 kg (355 lb) 168.7 kg (371 lb 14.7 oz)    History of present illness:   38 y/o no prior h/o Afib, h/o htn, major depressive disorder admitted with intermittent episodes of tachycardia and feeling heaviness on chest.. Has happened before a couple of times-last episode was about 2 weeks ago while mowing lawn.  Known htn and takes meds-unclear how regularly.  He apparently has had episodes of Afib treated at [facility] hospital in the past. His family states he is noncompliant on his HR controlling medicaitons  He was admitted to [facility] with Afib c RVR and placed on Cardizem Gtt which was transitioned to PO cardizem.  He converted on day of admission to NSR after a push of metoprolol.  Cardiology saw patient and opinion was that he could be d/c with close follow up and for CHad2Vasc2 score=2, would require only aspirin.  They have recommended Cardizem 180 SR and close OP monitoring for rpt Echocardiogram  He was found to have initally some elevated creatinine thought to be 2/2 to Gross body habitus and diuretics were d./c and this stabilized.  HIs blood pressure was elevated and he will need out-patient up titration of his medications  Consultations:  Cardiology  Discharge  Exam: Filed Vitals:   10/08/13 0817  BP: 156/96  Pulse: 82  Temp:   Resp: 20    General: eomi, ncat Cardiovascular:  s1 s2 RRR Respiratory:  Clear no added sound  Discharge Instructions You were cared for by a hospitalist during your hospital stay. If you have any questions about your discharge medications or the care you received while you were in the hospital after you are discharged, you can call the unit and asked to speak with the hospitalist on call if the hospitalist that took care of you is not available. Once you are discharged, your primary care physician will handle any further medical issues. Please note that NO REFILLS for any discharge medications will be authorized once you are discharged, as it is imperative that you return to your primary care physician (or establish a relationship with a primary care physician if you do not have one) for your aftercare needs so that they can reassess your need for medications and monitor your lab values.   Current Discharge Medication List    START taking these medications   Details  diltiazem (CARDIZEM CD) 180 MG 24 hr capsule Take 1 capsule (180 mg total) by mouth daily. Qty: 30 capsule, Refills: 0    potassium chloride SA (K-DUR,KLOR-CON) 20 MEQ tablet Take 2 tablets (40 mEq total) by mouth 2 (two) times daily. Qty: 60 tablet, Refills: 0      CONTINUE these medications which have NOT CHANGED   Details  traZODone (DESYREL) 50 MG tablet Take 1 tablet (50 mg total)  by mouth at bedtime and may repeat dose one time if needed. Qty: 30 tablet, Refills: 0      STOP taking these medications     cloNIDine (CATAPRES) 0.1 MG tablet      triamterene-hydrochlorothiazide (MAXZIDE-25) 37.5-25 MG per tablet        No Known Allergies    The results of significant diagnostics from this hospitalization (including imaging, microbiology, ancillary and laboratory) are listed below for reference.    Significant Diagnostic Studies: Dg  Chest Portable 1 View  10/06/2013   CLINICAL DATA:  CHEST PAIN CHEST PAIN  EXAM: PORTABLE CHEST - 1 VIEW  COMPARISON:  None available  FINDINGS: Mild cardiomegaly.  Lungs are clear. No effusion.  No pneumothorax. Visualized skeletal structures are unremarkable.  IMPRESSION: 1. Mild cardiomegaly.   Electronically Signed   By: Oley Balm M.D.   On: 10/06/2013 23:52    Microbiology: Recent Results (from the past 240 hour(s))  MRSA PCR SCREENING     Status: None   Collection Time    10/07/13  3:50 AM      Result Value Ref Range Status   MRSA by PCR NEGATIVE  NEGATIVE Final   Comment:            The GeneXpert MRSA Assay (FDA     approved for NASAL specimens     only), is one component of a     comprehensive MRSA colonization     surveillance program. It is not     intended to diagnose MRSA     infection nor to guide or     monitor treatment for     MRSA infections.     Labs: Basic Metabolic Panel:  Recent Labs Lab 10/06/13 2317 10/07/13 1020 10/08/13 0435  NA 141  --  141  K 3.2*  --  3.2*  CL 100  --  101  CO2 29  --  29  GLUCOSE 140*  --  139*  BUN 25*  --  26*  CREATININE 2.36*  --  1.81*  CALCIUM 8.8  --  8.7  MG  --  2.1 2.1   Liver Function Tests:  Recent Labs Lab 10/08/13 0435  AST 10  ALT 7  ALKPHOS 107  BILITOT 0.3  PROT 6.5  ALBUMIN 3.0*   No results found for this basename: LIPASE, AMYLASE,  in the last 168 hours No results found for this basename: AMMONIA,  in the last 168 hours CBC:  Recent Labs Lab 10/06/13 2317 10/08/13 0435  WBC 16.3* 11.0*  NEUTROABS 11.1*  --   HGB 13.9 12.8*  HCT 39.5 37.4*  MCV 83.2 83.7  PLT 284 259   Cardiac Enzymes:  Recent Labs Lab 10/07/13 0358 10/07/13 1020 10/07/13 1523  TROPONINI <0.30 <0.30 <0.30   BNP: BNP (last 3 results) No results found for this basename: PROBNP,  in the last 8760 hours CBG: No results found for this basename: GLUCAP,  in the last 168 hours     Signed:  Rhetta Mura  [facility] 10/08/2013, 9:01 AM

## 2013-10-08 NOTE — Progress Notes (Signed)
Patient with orders to be discharge home. Discharge instructions given, patient verbalized understanding. Prescriptions given. Patient stable. Patient left in private vehicle with family.  

## 2013-10-08 NOTE — Progress Notes (Signed)
Patient ID: Christopher Chambers, male   DOB: 11/01/1975, 38 y.o.   MRN: [id number]     Subjective:    No complaints this morning.    Objective:   Temp:  [97.5 F (36.4 C)-98.3 F (36.8 C)] 98.1 F (36.7 C) (09/03 2000) Pulse Rate:  [40-111] 82 (09/04 0817) Resp:  [13-22] 20 (09/04 0817) BP: (98-169)/(58-121) 156/96 mmHg (09/04 0817) SpO2:  [90 %-100 %] 100 % (09/04 0817)    Filed Weights   10/06/13 2302 10/07/13 0339  Weight: 355 lb (161.027 kg) 371 lb 14.7 oz (168.7 kg)    Intake/Output Summary (Last 24 hours) at 10/08/13 0833 Last data filed at 10/08/13 0818  Gross per 24 hour  Intake  889.2 ml  Output   1550 ml  Net -660.8 ml    Telemetry: NSR rate 70  Exam:  General: NAD  Resp: CTAB  Cardiac: RRR, no m/r/g, no JVD, no carotid bruits  GI: abdomen soft, NT, ND  MSK: no LE edema  Neuro: no focal deficits  Psych: appropriate affect  Lab Results:  Basic Metabolic Panel:  Recent Labs Lab 10/06/13 2317 10/07/13 1020 10/08/13 0435  NA 141  --  141  K 3.2*  --  3.2*  CL 100  --  101  CO2 29  --  29  GLUCOSE 140*  --  139*  BUN 25*  --  26*  CREATININE 2.36*  --  1.81*  CALCIUM 8.8  --  8.7  MG  --  2.1 2.1    Liver Function Tests:  Recent Labs Lab 10/08/13 0435  AST 10  ALT 7  ALKPHOS 107  BILITOT 0.3  PROT 6.5  ALBUMIN 3.0*    CBC:  Recent Labs Lab 10/06/13 2317 10/08/13 0435  WBC 16.3* 11.0*  HGB 13.9 12.8*  HCT 39.5 37.4*  MCV 83.2 83.7  PLT 284 259    Cardiac Enzymes:  Recent Labs Lab 10/07/13 0358 10/07/13 1020 10/07/13 1523  TROPONINI <0.30 <0.30 <0.30    BNP: No results found for this basename: PROBNP,  in the last 8760 hours  Coagulation: No results found for this basename: INR,  in the last 168 hours  ECG:   Medications:   Scheduled Medications: . aspirin EC  325 mg Oral Daily  . diltiazem  30 mg Oral 4 times per day  . docusate sodium  100 mg Oral BID  . sodium chloride  3 mL Intravenous Q12H  .  traZODone  50 mg Oral QHS,MR X 1     Infusions:     PRN Medications:  ondansetron (ZOFRAN) IV, ondansetron     Assessment/Plan   1. Atrial fib with RVR:  - initially on IV dilt gtt, started on oral dilt yesterday. This morning back in NSR rates 70s. He is off dilt gtt. Will start oral dilt long acting  daily based on response to short acting dilt, may need further titration as outpatient.  - CHADS2Vasc score is 1 (pending echo results), continue ASA.    2. Hypertension:  bp's somewhat up and down, he is on oral dilt with first dose of maxide written this AM - with recurrent hypoK would avoid HCTZ alone, maxide is better option - follow bp's today, may need additional agent.   3. CKD: Stage III: - Cr down from yesterday - continue to follow.   4. Likely OSA:  - likely complicating his afib and HTN - will need outpatient sleep study  5. Hypokalemia - Will write  for KCl x 2 today - Will need repeat BMET 1 week after discharge - follow K now that off HCTZ and on maxide.     Echo schedule busy today, no strong indication to keep him here to have done as inpatient. Will arrange as outpatient at follow up. Will need to f/u with NP Lyman Bishop in 1 week, needs outpatient BMET. Will sign off inpatient care.      Dina Rich, M.D., F.A.C.C.

## 2013-11-14 ENCOUNTER — Encounter (HOSPITAL_COMMUNITY): Payer: Self-pay | Admitting: Emergency Medicine

## 2013-11-14 ENCOUNTER — Emergency Department (HOSPITAL_COMMUNITY)
Admission: EM | Admit: 2013-11-14 | Discharge: 2013-11-14 | Disposition: A | Discharge status: Home / Self Care | Payer: Self-pay | Attending: Emergency Medicine | Admitting: Emergency Medicine

## 2013-11-14 ENCOUNTER — Emergency Department (HOSPITAL_COMMUNITY): Payer: Self-pay

## 2013-11-14 DIAGNOSIS — Z79899 Other long term (current) drug therapy: Secondary | ICD-10-CM | POA: Insufficient documentation

## 2013-11-14 DIAGNOSIS — Y9389 Activity, other specified: Secondary | ICD-10-CM | POA: Insufficient documentation

## 2013-11-14 DIAGNOSIS — S39012A Strain of muscle, fascia and tendon of lower back, initial encounter: Secondary | ICD-10-CM | POA: Insufficient documentation

## 2013-11-14 DIAGNOSIS — Z7982 Long term (current) use of aspirin: Secondary | ICD-10-CM | POA: Insufficient documentation

## 2013-11-14 DIAGNOSIS — Z8659 Personal history of other mental and behavioral disorders: Secondary | ICD-10-CM | POA: Insufficient documentation

## 2013-11-14 DIAGNOSIS — I4891 Unspecified atrial fibrillation: Secondary | ICD-10-CM | POA: Insufficient documentation

## 2013-11-14 DIAGNOSIS — S8001XA Contusion of right knee, initial encounter: Secondary | ICD-10-CM | POA: Insufficient documentation

## 2013-11-14 DIAGNOSIS — Z720 Tobacco use: Secondary | ICD-10-CM | POA: Insufficient documentation

## 2013-11-14 DIAGNOSIS — Y92410 Unspecified street and highway as the place of occurrence of the external cause: Secondary | ICD-10-CM | POA: Insufficient documentation

## 2013-11-14 DIAGNOSIS — I10 Essential (primary) hypertension: Secondary | ICD-10-CM | POA: Insufficient documentation

## 2013-11-14 MED ORDER — CYCLOBENZAPRINE HCL 10 MG PO TABS: 10.0000 mg | ORAL_TABLET | Freq: Two times a day (BID) | ORAL | Status: DC | PRN

## 2013-11-14 MED ORDER — OXYCODONE-ACETAMINOPHEN 5-325 MG PO TABS
1.0000 | ORAL_TABLET | Freq: Once | ORAL | Status: AC
  Administered 2013-11-14: 1 via ORAL
  Filled 2013-11-14: qty 1

## 2013-11-14 MED ORDER — IBUPROFEN 800 MG PO TABS: 800.0000 mg | ORAL_TABLET | Freq: Three times a day (TID) | ORAL | Status: DC

## 2013-11-14 MED ORDER — OXYCODONE-ACETAMINOPHEN 5-325 MG PO TABS: 1.0000 | ORAL_TABLET | ORAL | Status: DC | PRN

## 2013-11-14 NOTE — ED Provider Notes (Signed)
Medical screening examination/treatment/procedure(s) were performed by non-physician practitioner and as supervising physician I was immediately available for consultation/collaboration.     Coralyn Roselli, MD 11/14/13 2350 

## 2013-11-14 NOTE — Discharge Instructions (Signed)
Muscle Strain °A muscle strain is an injury that occurs when a muscle is stretched beyond its normal length. Usually a small number of muscle fibers are torn when this happens. Muscle strain is rated in degrees. First-degree strains have the least amount of muscle fiber tearing and pain. Second-degree and third-degree strains have increasingly more tearing and pain.  °Usually, recovery from muscle strain takes 1-2 weeks. Complete healing takes 5-6 weeks.  °CAUSES  °Muscle strain happens when a sudden, violent force placed on a muscle stretches it too far. This may occur with lifting, sports, or a fall.  °RISK FACTORS °Muscle strain is especially common in athletes.  °SIGNS AND SYMPTOMS °At the site of the muscle strain, there may be: °· Pain. °· Bruising. °· Swelling. °· Difficulty using the muscle due to pain or lack of normal function. °DIAGNOSIS  °Your health care provider will perform a physical exam and ask about your medical history. °TREATMENT  °Often, the best treatment for a muscle strain is resting, icing, and applying cold compresses to the injured area.   °HOME CARE INSTRUCTIONS  °· Use the PRICE method of treatment to promote muscle healing during the first 2-3 days after your injury. The PRICE method involves: °¨ Protecting the muscle from being injured again. °¨ Restricting your activity and resting the injured body part. °¨ Icing your injury. To do this, put ice in a plastic bag. Place a towel between your skin and the bag. Then, apply the ice and leave it on from 15-20 minutes each hour. After the third day, switch to moist heat packs. °¨ Apply compression to the injured area with a splint or elastic bandage. Be careful not to wrap it too tightly. This may interfere with blood circulation or increase swelling. °¨ Elevate the injured body part above the level of your heart as often as you can. °· Only take over-the-counter or prescription medicines for pain, discomfort, or fever as directed by your  health care provider. °· Warming up prior to exercise helps to prevent future muscle strains. °SEEK MEDICAL CARE IF:  °· You have increasing pain or swelling in the injured area. °· You have numbness, tingling, or a significant loss of strength in the injured area. °MAKE SURE YOU:  °· Understand these instructions. °· Will watch your condition. °· Will get help right away if you are not doing well or get worse. °Document Released: 01/21/2005 Document Revised: 11/11/2012 Document Reviewed: 08/20/2012 °ExitCare® Patient Information ©2015 [employer]. This information is not intended to replace advice given to you by your health care provider. Make sure you discuss any questions you have with your health care provider. °Cryotherapy °Cryotherapy means treatment with cold. Ice or gel packs can be used to reduce both pain and swelling. Ice is the most helpful within the first 24 to 48 hours after an injury or flare-up from overusing a muscle or joint. Sprains, strains, spasms, burning pain, shooting pain, and aches can all be eased with ice. Ice can also be used when recovering from surgery. Ice is effective, has very few side effects, and is safe for most people to use. °PRECAUTIONS  °Ice is not a safe treatment option for people with: °· Raynaud phenomenon. This is a condition affecting small blood vessels in the extremities. Exposure to cold may cause your problems to return. °· Cold hypersensitivity. There are many forms of cold hypersensitivity, including: °· Cold urticaria. Red, itchy hives appear on the skin when the tissues begin to warm after being   iced. °· Cold erythema. This is a red, itchy rash caused by exposure to cold. °· Cold hemoglobinuria. Red blood cells break down when the tissues begin to warm after being iced. The hemoglobin that carry oxygen are passed into the urine because they cannot combine with blood proteins fast enough. °· Numbness or altered sensitivity in the area being iced. °If you have  any of the following conditions, do not use ice until you have discussed cryotherapy with your caregiver: °· Heart conditions, such as arrhythmia, angina, or chronic heart disease. °· High blood pressure. °· Healing wounds or open skin in the area being iced. °· Current infections. °· Rheumatoid arthritis. °· Poor circulation. °· Diabetes. °Ice slows the blood flow in the region it is applied. This is beneficial when trying to stop inflamed tissues from spreading irritating chemicals to surrounding tissues. However, if you expose your skin to cold temperatures for too long or without the proper protection, you can damage your skin or nerves. Watch for signs of skin damage due to cold. °HOME CARE INSTRUCTIONS °Follow these tips to use ice and cold packs safely. °· Place a dry or damp towel between the ice and skin. A damp towel will cool the skin more quickly, so you may need to shorten the time that the ice is used. °· For a more rapid response, add gentle compression to the ice. °· Ice for no more than 10 to 20 minutes at a time. The bonier the area you are icing, the less time it will take to get the benefits of ice. °· Check your skin after 5 minutes to make sure there are no signs of a poor response to cold or skin damage. °· Rest 20 minutes or more between uses. °· Once your skin is numb, you can end your treatment. You can test numbness by very lightly touching your skin. The touch should be so light that you do not see the skin dimple from the pressure of your fingertip. When using ice, most people will feel these normal sensations in this order: cold, burning, aching, and numbness. °· Do not use ice on someone who cannot communicate their responses to pain, such as small children or people with dementia. °HOW TO MAKE AN ICE PACK °Ice packs are the most common way to use ice therapy. Other methods include ice massage, ice baths, and cryosprays. Muscle creams that cause a cold, tingly feeling do not offer the  same benefits that ice offers and should not be used as a substitute unless recommended by your caregiver. °To make an ice pack, do one of the following: °· Place crushed ice or a bag of frozen vegetables in a sealable plastic bag. Squeeze out the excess air. Place this bag inside another plastic bag. Slide the bag into a pillowcase or place a damp towel between your skin and the bag. °· Mix 3 parts water with 1 part rubbing alcohol. Freeze the mixture in a sealable plastic bag. When you remove the mixture from the freezer, it will be slushy. Squeeze out the excess air. Place this bag inside another plastic bag. Slide the bag into a pillowcase or place a damp towel between your skin and the bag. °SEEK MEDICAL CARE IF: °· You develop white spots on your skin. This may give the skin a blotchy (mottled) appearance. °· Your skin turns blue or pale. °· Your skin becomes waxy or hard. °· Your swelling gets worse. °MAKE SURE YOU:  °· Understand these instructions. °·   Will watch your condition. °· Will get help right away if you are not doing well or get worse. °Document Released: 09/17/2010 Document Revised: 06/07/2013 Document Reviewed: 09/17/2010 °ExitCare® Patient Information ©2015 [employer]. This information is not intended to replace advice given to you by your health care provider. Make sure you discuss any questions you have with your health care provider. °Motor Vehicle Collision °It is common to have multiple bruises and sore muscles after a motor vehicle collision (MVC). These tend to feel worse for the first 24 hours. You may have the most stiffness and soreness over the first several hours. You may also feel worse when you wake up the first morning after your collision. After this point, you will usually begin to improve with each day. The speed of improvement often depends on the severity of the collision, the number of injuries, and the location and nature of these injuries. °HOME CARE INSTRUCTIONS °· Put  ice on the injured area. °· Put ice in a plastic bag. °· Place a towel between your skin and the bag. °· Leave the ice on for 15-20 minutes, 3-4 times a day, or as directed by your health care provider. °· Drink enough fluids to keep your urine clear or pale yellow. Do not drink alcohol. °· Take a warm shower or bath once or twice a day. This will increase blood flow to sore muscles. °· You may return to activities as directed by your caregiver. Be careful when lifting, as this may aggravate neck or back pain. °· Only take over-the-counter or prescription medicines for pain, discomfort, or fever as directed by your caregiver. Do not use aspirin. This may increase bruising and bleeding. °SEEK IMMEDIATE MEDICAL CARE IF: °· You have numbness, tingling, or weakness in the arms or legs. °· You develop severe headaches not relieved with medicine. °· You have severe neck pain, especially tenderness in the middle of the back of your neck. °· You have changes in bowel or bladder control. °· There is increasing pain in any area of the body. °· You have shortness of breath, light-headedness, dizziness, or fainting. °· You have chest pain. °· You feel sick to your stomach (nauseous), throw up (vomit), or sweat. °· You have increasing abdominal discomfort. °· There is blood in your urine, stool, or vomit. °· You have pain in your shoulder (shoulder strap areas). °· You feel your symptoms are getting worse. °MAKE SURE YOU: °· Understand these instructions. °· Will watch your condition. °· Will get help right away if you are not doing well or get worse. °Document Released: 01/21/2005 Document Revised: 06/07/2013 Document Reviewed: 06/20/2010 °ExitCare® Patient Information ©2015 [employer]. This information is not intended to replace advice given to you by your health care provider. Make sure you discuss any questions you have with your health care provider. ° °

## 2013-11-14 NOTE — ED Notes (Signed)
Discharge instructions and prescriptions given and reviewed with patient.  Patient verbalized understanding of sedating effects of medication and to follow up with orthopedics as needed.  Patient discharged home in good condition via wheelchair.
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
## 2016-08-16 ENCOUNTER — Emergency Department (HOSPITAL_COMMUNITY)
Admission: EM | Admit: 2016-08-16 | Discharge: 2016-08-16 | Disposition: A | Discharge status: Home / Self Care | Payer: Self-pay | Attending: Emergency Medicine | Admitting: Emergency Medicine

## 2016-08-16 ENCOUNTER — Encounter (HOSPITAL_COMMUNITY): Payer: Self-pay | Admitting: Emergency Medicine

## 2016-08-16 DIAGNOSIS — Z7982 Long term (current) use of aspirin: Secondary | ICD-10-CM | POA: Insufficient documentation

## 2016-08-16 DIAGNOSIS — I10 Essential (primary) hypertension: Secondary | ICD-10-CM | POA: Insufficient documentation

## 2016-08-16 DIAGNOSIS — Z79899 Other long term (current) drug therapy: Secondary | ICD-10-CM | POA: Insufficient documentation

## 2016-08-16 DIAGNOSIS — F17210 Nicotine dependence, cigarettes, uncomplicated: Secondary | ICD-10-CM | POA: Insufficient documentation

## 2016-08-16 DIAGNOSIS — Z7984 Long term (current) use of oral hypoglycemic drugs: Secondary | ICD-10-CM | POA: Insufficient documentation

## 2016-08-16 DIAGNOSIS — R739 Hyperglycemia, unspecified: Secondary | ICD-10-CM | POA: Insufficient documentation

## 2016-08-16 LAB — CBC WITH DIFFERENTIAL/PLATELET
Basophils Absolute: 0 10*3/uL (ref 0.0–0.1)
Basophils Relative: 0 %
EOS PCT: 2 %
Eosinophils Absolute: 0.2 10*3/uL (ref 0.0–0.7)
HCT: 41.1 % (ref 39.0–52.0)
HEMOGLOBIN: 14.3 g/dL (ref 13.0–17.0)
LYMPHS ABS: 1.8 10*3/uL (ref 0.7–4.0)
Lymphocytes Relative: 18 %
MCH: 28.9 pg (ref 26.0–34.0)
MCHC: 34.8 g/dL (ref 30.0–36.0)
MCV: 83.2 fL (ref 78.0–100.0)
MONOS PCT: 10 %
Monocytes Absolute: 1 10*3/uL (ref 0.1–1.0)
Neutro Abs: 7.3 10*3/uL (ref 1.7–7.7)
Neutrophils Relative %: 70 %
Platelets: 242 10*3/uL (ref 150–400)
RBC: 4.94 MIL/uL (ref 4.22–5.81)
RDW: 13.3 % (ref 11.5–15.5)
WBC: 10.3 10*3/uL (ref 4.0–10.5)

## 2016-08-16 LAB — URINALYSIS, ROUTINE W REFLEX MICROSCOPIC
BACTERIA UA: NONE SEEN
BILIRUBIN URINE: NEGATIVE
Hgb urine dipstick: NEGATIVE
Ketones, ur: 5 mg/dL — AB
LEUKOCYTES UA: NEGATIVE
Nitrite: NEGATIVE
PROTEIN: 30 mg/dL — AB
Specific Gravity, Urine: 1.018 (ref 1.005–1.030)
pH: 5 (ref 5.0–8.0)

## 2016-08-16 LAB — COMPREHENSIVE METABOLIC PANEL
ALK PHOS: 106 U/L (ref 38–126)
ALT: 15 U/L — ABNORMAL LOW (ref 17–63)
AST: 12 U/L — ABNORMAL LOW (ref 15–41)
Albumin: 3.6 g/dL (ref 3.5–5.0)
Anion gap: 10 (ref 5–15)
BUN: 39 mg/dL — ABNORMAL HIGH (ref 6–20)
CO2: 25 mmol/L (ref 22–32)
Calcium: 9.2 mg/dL (ref 8.9–10.3)
Chloride: 97 mmol/L — ABNORMAL LOW (ref 101–111)
Creatinine, Ser: 2.53 mg/dL — ABNORMAL HIGH (ref 0.61–1.24)
GFR calc Af Amer: 34 mL/min — ABNORMAL LOW (ref >60)
GFR calc non Af Amer: 30 mL/min — ABNORMAL LOW (ref >60)
Glucose, Bld: 419 mg/dL — ABNORMAL HIGH (ref 65–99)
Potassium: 4.2 mmol/L (ref 3.5–5.1)
SODIUM: 132 mmol/L — AB (ref 135–145)
Total Bilirubin: 0.9 mg/dL (ref 0.3–1.2)
Total Protein: 7.5 g/dL (ref 6.5–8.1)

## 2016-08-16 LAB — LIPASE, BLOOD: Lipase: 52 U/L — ABNORMAL HIGH (ref 11–51)

## 2016-08-16 LAB — CBG MONITORING, ED
Glucose-Capillary: 402 mg/dL — ABNORMAL HIGH (ref 65–99)
Glucose-Capillary: 388 mg/dL — ABNORMAL HIGH (ref 65–99)
Glucose-Capillary: 357 mg/dL — ABNORMAL HIGH (ref 65–99)
Glucose-Capillary: 337 mg/dL — ABNORMAL HIGH (ref 65–99)

## 2016-08-16 MED ORDER — SODIUM CHLORIDE 0.9 % IV BOLUS (SEPSIS)
1000.0000 mL | Freq: Once | INTRAVENOUS | Status: AC
  Administered 2016-08-16: 1000 mL via INTRAVENOUS

## 2016-08-16 MED ORDER — INSULIN GLARGINE 100 UNIT/ML ~~LOC~~ SOLN: 20.0000 [IU] | Freq: Every day | SUBCUTANEOUS | 11 refills | Status: DC

## 2016-08-16 MED ORDER — INSULIN ASPART 100 UNIT/ML ~~LOC~~ SOLN
10.0000 [IU] | Freq: Once | SUBCUTANEOUS | Status: AC
  Administered 2016-08-16: 10 [IU] via SUBCUTANEOUS
  Filled 2016-08-16: qty 1

## 2016-08-16 MED ORDER — INSULIN GLARGINE 100 UNIT/ML ~~LOC~~ SOLN
20.0000 [IU] | SUBCUTANEOUS | Status: AC
  Administered 2016-08-16: 20 [IU] via SUBCUTANEOUS
  Filled 2016-08-16: qty 0.2

## 2016-08-16 MED ORDER — INSULIN ASPART 100 UNIT/ML ~~LOC~~ SOLN
10.0000 [IU] | Freq: Once | SUBCUTANEOUS | Status: AC
  Administered 2016-08-16: 10 [IU] via SUBCUTANEOUS

## 2016-08-16 MED ORDER — INSULIN ASPART 100 UNIT/ML ~~LOC~~ SOLN
SUBCUTANEOUS | Status: AC
  Filled 2016-08-16: qty 1

## 2016-08-16 NOTE — ED Triage Notes (Signed)
Pt reports elevated blood sugar for last several days. Pt reports ranged from 500-530. CBG in ED at this time 402. Pt denies any abd pain,fever,n/v. Pt reports increased urinary frequency.

## 2016-08-16 NOTE — ED Provider Notes (Signed)
[facility] Provider Note   CSN: [id number] Arrival date & time: 08/16/16  0750     History   Chief Complaint Chief Complaint  Patient presents with  . Hyperglycemia    HPI Christopher Chambers is a 41 y.o. male.   Hyperglycemia    41 y/o male, known diabetic, diagnosed in Nov when he was incarcerated in prison Was on insulin for a week, transitioned successfully to oral meds but has noted increased CBG over the last couple of weeks - > 500 this week - increased thirst and urinary frequency.  No fevers, vomiting, swelling, diarreha, CP, SOB or abd pain - he has in termittent blurred vision but no light headedness - currently taking oral meds including glipizide, metformin and lisinopril as well as xarelto and flecainide - developed afib in prison.  Sx are constant, gradually worsening, and despite taking a strict diabetic diet has noted ongoing hyperglycemia.  Past Medical History:  Diagnosis Date  . Atrial fibrillation (HCC)   . Bipolar disorder (HCC)   . Depression   . Hypertension     Patient Active Problem List   Diagnosis Date Noted  . Atrial fibrillation with rapid ventricular response (HCC) 10/07/2013  . Morbid obesity (HCC) 10/07/2013  . Sleep apnea 10/07/2013  . A-fib (HCC) 10/07/2013  . Polysubstance abuse 09/16/2012  . Bipolar disorder (HCC)   . Depression   . Hypertension     History reviewed. No pertinent surgical history.     Home Medications    Prior to Admission medications   Medication Sig Start Date End Date Taking? Authorizing Provider  acetaminophen (TYLENOL) 500 MG tablet Take 1,000 mg by mouth every 6 (six) hours as needed for mild pain.   Yes [provider]  aspirin EC 81 MG tablet Take 81 mg by mouth daily.   Yes [provider]  diltiazem (CARDIZEM CD) 180 MG 24 hr capsule Take 1 capsule (180 mg total) by mouth daily. 10/08/13  Yes Rhetta Mura, MD  flecainide (TAMBOCOR) 50 MG tablet Take 50 mg by mouth  daily.   Yes [provider]  glipiZIDE (GLUCOTROL) 10 MG tablet Take 1 tablet by mouth 2 (two) times daily. 07/23/16  Yes [provider]  JANUVIA 100 MG tablet Take 1 tablet by mouth daily. 07/23/16  Yes [provider]  lisinopril-hydrochlorothiazide (PRINZIDE,ZESTORETIC) 20-25 MG tablet Take 1 tablet by mouth daily. 07/23/16  Yes [provider]  insulin glargine (LANTUS) 100 UNIT/ML injection Inject 0.2 mLs (20 Units total) into the skin daily with breakfast. 08/16/16 09/15/16  Eber Hong, MD    Family History Family History  Problem Relation Age of Onset  . Heart failure Maternal Grandmother     Social History Social History  Substance Use Topics  . Smoking status: Current Every Day Smoker    Packs/day: 0.25    Types: Cigarettes  . Smokeless tobacco: Never Used  . Alcohol use No     Allergies   Patient has no known allergies.   Review of Systems Review of Systems  All other systems reviewed and are negative.    Physical Exam Updated Vital Signs BP 102/69   Pulse 83   Temp 98 F (36.7 C) (Oral)   Resp 14   Ht 6\' 2"  (1.88 m)   Wt (!) 163.3 kg (360 lb)   SpO2 98%   BMI 46.22 kg/m   Physical Exam  Constitutional: He appears well-developed and well-nourished. No distress.  HENT:  Head: Normocephalic and atraumatic.  Mouth/Throat: Oropharynx is clear and moist. No oropharyngeal exudate.  Eyes: Pupils are equal, round, and reactive to light. Conjunctivae and EOM are normal. Right eye exhibits no discharge. Left eye exhibits no discharge. No scleral icterus.  Neck: Normal range of motion. Neck supple. No JVD present. No thyromegaly present.  Cardiovascular: Normal rate, regular rhythm, normal heart sounds and intact distal pulses.  Exam reveals no gallop and no friction rub.   No murmur heard. Pulmonary/Chest: Effort normal and breath sounds normal. No respiratory distress. He has no wheezes. He has no rales.  Abdominal: Soft.  Bowel sounds are normal. He exhibits no distension and no mass. There is no tenderness.  Musculoskeletal: Normal range of motion. He exhibits no edema or tenderness.  Lymphadenopathy:    He has no cervical adenopathy.  Neurological: He is alert. Coordination normal.  Skin: Skin is warm and dry. No rash noted. No erythema.  Psychiatric: He has a normal mood and affect. His behavior is normal.  Nursing note and vitals reviewed.    ED Treatments / Results  Labs (all labs ordered are listed, but only abnormal results are displayed) Labs Reviewed  COMPREHENSIVE METABOLIC PANEL - Abnormal; Notable for the following:       Result Value   Sodium 132 (*)    Chloride 97 (*)    Glucose, Bld 419 (*)    BUN 39 (*)    Creatinine, Ser 2.53 (*)    AST 12 (*)    ALT 15 (*)    GFR calc non Af Amer 30 (*)    GFR calc Af Amer 34 (*)    All other components within normal limits  LIPASE, BLOOD - Abnormal; Notable for the following:    Lipase 52 (*)    All other components within normal limits  URINALYSIS, ROUTINE W REFLEX MICROSCOPIC - Abnormal; Notable for the following:    Glucose, UA >=500 (*)    Ketones, ur 5 (*)    Protein, ur 30 (*)    Squamous Epithelial / LPF 0-5 (*)    All other components within normal limits  CBG MONITORING, ED - Abnormal; Notable for the following:    Glucose-Capillary 402 (*)    All other components within normal limits  CBG MONITORING, ED - Abnormal; Notable for the following:    Glucose-Capillary 388 (*)    All other components within normal limits  CBG MONITORING, ED - Abnormal; Notable for the following:    Glucose-Capillary 357 (*)    All other components within normal limits  CBG MONITORING, ED - Abnormal; Notable for the following:    Glucose-Capillary 337 (*)    All other components within normal limits  CBC WITH DIFFERENTIAL/PLATELET     Radiology No results found.  Procedures Procedures (including critical care time)  Medications Ordered in  ED Medications  insulin glargine (LANTUS) injection 20 Units (not administered)  sodium chloride 0.9 % bolus 1,000 mL (0 mLs Intravenous Stopped 08/16/16 1138)  insulin aspart (novoLOG) injection 10 Units (10 Units Subcutaneous Given 08/16/16 0944)  insulin aspart (novoLOG) injection 10 Units (10 Units Subcutaneous Given 08/16/16 1138)     Initial Impression / Assessment and Plan / ED Course  I have reviewed the triage vital signs and the nursing notes.  Pertinent labs & imaging results that were available during my care of the patient were reviewed by me and considered in my medical decision making (see chart for details).     Exam is unremarkable eval for  source of hyperglycemia Check labs, fluids, insulin  Rechecked several times - given 2 L of IVF, CBG continue to improve Pt given instructions on general care and f/u. Stable for d/c. Started lantus here - Rx for home.  Final Clinical Impressions(s) / ED Diagnoses   Final diagnoses:  Hyperglycemia    New Prescriptions New Prescriptions   INSULIN GLARGINE (LANTUS) 100 UNIT/ML INJECTION    Inject 0.2 mLs (20 Units total) into the skin daily with breakfast.     Eber Hong, MD 08/16/16 1316

## 2016-08-16 NOTE — Discharge Instructions (Signed)
Please follow-up with your doctor within one to 2 weeks. He will need to check her blood sugar 3 times a day and keep an accurate record of these numbers.  In addition to the medications that you are already taking you should take a morning dose of Lantus insulin, 20 units, pick this up from the pharmacy today and start taking it tomorrow morning as we have given you your dose for today. If you develop lowering blood sugars below 100, U should stop this medication and call your doctor or come to the emergency department. If your blood sugar continues to arise above 400 consistently U will need to be seen again as well.  Additionally if you should develop severe or worsening headache, weakness, difficulty breathing, blurred vision, chest pain or shortness of breath, return to the emergency department immediately

## 2016-08-28 ENCOUNTER — Encounter (HOSPITAL_COMMUNITY): Payer: Self-pay | Admitting: Emergency Medicine

## 2016-08-28 ENCOUNTER — Emergency Department (HOSPITAL_COMMUNITY): Payer: Self-pay

## 2016-08-28 ENCOUNTER — Emergency Department (HOSPITAL_COMMUNITY)
Admission: EM | Admit: 2016-08-28 | Discharge: 2016-08-28 | Disposition: A | Discharge status: Home / Self Care | Payer: Self-pay | Attending: Emergency Medicine | Admitting: Emergency Medicine

## 2016-08-28 DIAGNOSIS — I10 Essential (primary) hypertension: Secondary | ICD-10-CM | POA: Insufficient documentation

## 2016-08-28 DIAGNOSIS — L0291 Cutaneous abscess, unspecified: Secondary | ICD-10-CM

## 2016-08-28 DIAGNOSIS — Z7984 Long term (current) use of oral hypoglycemic drugs: Secondary | ICD-10-CM | POA: Insufficient documentation

## 2016-08-28 DIAGNOSIS — E119 Type 2 diabetes mellitus without complications: Secondary | ICD-10-CM | POA: Insufficient documentation

## 2016-08-28 DIAGNOSIS — R109 Unspecified abdominal pain: Secondary | ICD-10-CM

## 2016-08-28 DIAGNOSIS — Z79899 Other long term (current) drug therapy: Secondary | ICD-10-CM | POA: Insufficient documentation

## 2016-08-28 DIAGNOSIS — F17210 Nicotine dependence, cigarettes, uncomplicated: Secondary | ICD-10-CM | POA: Insufficient documentation

## 2016-08-28 DIAGNOSIS — L02211 Cutaneous abscess of abdominal wall: Secondary | ICD-10-CM | POA: Insufficient documentation

## 2016-08-28 HISTORY — DX: Type 2 diabetes mellitus without complications: E11.9

## 2016-08-28 LAB — CBC WITH DIFFERENTIAL/PLATELET
Basophils Absolute: 0 10*3/uL (ref 0.0–0.1)
Basophils Relative: 0 %
Eosinophils Absolute: 0.2 10*3/uL (ref 0.0–0.7)
Eosinophils Relative: 1 %
HCT: 39.9 % (ref 39.0–52.0)
Hemoglobin: 14.2 g/dL (ref 13.0–17.0)
Lymphocytes Relative: 15 %
Lymphs Abs: 2.3 10*3/uL (ref 0.7–4.0)
MCH: 29.3 pg (ref 26.0–34.0)
MCHC: 35.6 g/dL (ref 30.0–36.0)
MCV: 82.4 fL (ref 78.0–100.0)
Monocytes Absolute: 1.7 10*3/uL — ABNORMAL HIGH (ref 0.1–1.0)
Monocytes Relative: 11 %
Neutro Abs: 11.1 10*3/uL — ABNORMAL HIGH (ref 1.7–7.7)
Neutrophils Relative %: 73 %
Platelets: 259 10*3/uL (ref 150–400)
RBC: 4.84 MIL/uL (ref 4.22–5.81)
RDW: 13 % (ref 11.5–15.5)
WBC: 15.3 10*3/uL — ABNORMAL HIGH (ref 4.0–10.5)

## 2016-08-28 LAB — CBG MONITORING, ED
Glucose-Capillary: 530 mg/dL (ref 65–99)
Glucose-Capillary: 405 mg/dL — ABNORMAL HIGH (ref 65–99)
Glucose-Capillary: 419 mg/dL — ABNORMAL HIGH (ref 65–99)

## 2016-08-28 LAB — COMPREHENSIVE METABOLIC PANEL
ALT: 12 U/L — ABNORMAL LOW (ref 17–63)
AST: 10 U/L — ABNORMAL LOW (ref 15–41)
Albumin: 3.4 g/dL — ABNORMAL LOW (ref 3.5–5.0)
Alkaline Phosphatase: 101 U/L (ref 38–126)
Anion gap: 12 (ref 5–15)
BUN: 32 mg/dL — ABNORMAL HIGH (ref 6–20)
CO2: 24 mmol/L (ref 22–32)
Calcium: 9.3 mg/dL (ref 8.9–10.3)
Chloride: 93 mmol/L — ABNORMAL LOW (ref 101–111)
Creatinine, Ser: 2.5 mg/dL — ABNORMAL HIGH (ref 0.61–1.24)
GFR calc Af Amer: 35 mL/min — ABNORMAL LOW (ref >60)
GFR calc non Af Amer: 30 mL/min — ABNORMAL LOW (ref >60)
Glucose, Bld: 513 mg/dL (ref 65–99)
Potassium: 4.1 mmol/L (ref 3.5–5.1)
Sodium: 129 mmol/L — ABNORMAL LOW (ref 135–145)
Total Bilirubin: 0.9 mg/dL (ref 0.3–1.2)
Total Protein: 7.8 g/dL (ref 6.5–8.1)

## 2016-08-28 MED ORDER — SULFAMETHOXAZOLE-TRIMETHOPRIM 800-160 MG PO TABS: 1.0000 | ORAL_TABLET | Freq: Two times a day (BID) | ORAL | 0 refills | Status: AC

## 2016-08-28 MED ORDER — SODIUM CHLORIDE 0.9 % IV BOLUS (SEPSIS)
1000.0000 mL | Freq: Once | INTRAVENOUS | Status: AC
  Administered 2016-08-28: 1000 mL via INTRAVENOUS

## 2016-08-28 MED ORDER — SULFAMETHOXAZOLE-TRIMETHOPRIM 800-160 MG PO TABS
1.0000 | ORAL_TABLET | Freq: Once | ORAL | Status: AC
  Administered 2016-08-28: 1 via ORAL
  Filled 2016-08-28: qty 1

## 2016-08-28 MED ORDER — INSULIN ASPART 100 UNIT/ML IV SOLN
10.0000 [IU] | Freq: Once | INTRAVENOUS | Status: AC
  Administered 2016-08-28: 10 [IU] via INTRAVENOUS

## 2016-08-28 NOTE — ED Provider Notes (Signed)
[facility] Provider Note   CSN: [id number] Arrival date & time: 08/28/16  0701     History   Chief Complaint Chief Complaint  Patient presents with  . Abscess    HPI Christopher Chambers is a 40 y.o. male.  Patient complains of swelling to his lower abdomen and his blood sugars have been elevated. He is followed at the health department and they are going to start him on some insulin when they get some shortly at the health department   The history is provided by the patient.  Abscess  Abscess location: Abdomen. Abscess quality: fluctuance   Abscess quality: not draining   Red streaking: no   Progression:  Worsening Chronicity:  New Context: diabetes   Relieved by:  Nothing Worsened by:  Nothing Ineffective treatments:  None tried Associated symptoms: no anorexia, no fatigue and no headaches     Past Medical History:  Diagnosis Date  . Atrial fibrillation (HCC)   . Bipolar disorder (HCC)   . Depression   . Diabetes mellitus without complication (HCC)   . Hypertension     Patient Active Problem List   Diagnosis Date Noted  . Atrial fibrillation with rapid ventricular response (HCC) 10/07/2013  . Morbid obesity (HCC) 10/07/2013  . Sleep apnea 10/07/2013  . A-fib (HCC) 10/07/2013  . Polysubstance abuse 09/16/2012  . Bipolar disorder (HCC)   . Depression   . Hypertension     History reviewed. No pertinent surgical history.     Home Medications    Prior to Admission medications   Medication Sig Start Date End Date Taking? Authorizing Provider  acetaminophen (TYLENOL) 500 MG tablet Take 1,000 mg by mouth every 6 (six) hours as needed for mild pain.   Yes [provider]  diltiazem (CARDIZEM CD) 180 MG 24 hr capsule Take 1 capsule (180 mg total) by mouth daily. 10/08/13  Yes Rhetta Mura, MD  flecainide (TAMBOCOR) 50 MG tablet Take 50 mg by mouth daily.   Yes [provider]  glipiZIDE (GLUCOTROL) 10 MG tablet Take 1 tablet by  mouth 2 (two) times daily. 07/23/16  Yes [provider]  JANUVIA 100 MG tablet Take 1 tablet by mouth daily. 07/23/16  Yes [provider]  lisinopril-hydrochlorothiazide (PRINZIDE,ZESTORETIC) 20-25 MG tablet Take 1 tablet by mouth daily. 07/23/16  Yes [provider]    Family History Family History  Problem Relation Age of Onset  . Heart failure Maternal Grandmother     Social History Social History  Substance Use Topics  . Smoking status: Current Every Day Smoker    Packs/day: 0.25    Types: Cigarettes  . Smokeless tobacco: Never Used  . Alcohol use No     Allergies   Patient has no known allergies.   Review of Systems Review of Systems  Constitutional: Negative for appetite change and fatigue.  HENT: Negative for congestion, ear discharge and sinus pressure.   Eyes: Negative for discharge.  Respiratory: Negative for cough.   Cardiovascular: Negative for chest pain.  Gastrointestinal: Positive for abdominal pain. Negative for anorexia and diarrhea.  Genitourinary: Negative for frequency and hematuria.  Musculoskeletal: Negative for back pain.  Skin: Negative for rash.  Neurological: Negative for seizures and headaches.  Psychiatric/Behavioral: Negative for hallucinations.     Physical Exam Updated Vital Signs BP 112/65   Pulse 86   Temp 98.5 F (36.9 C) (Oral)   Resp 20   Ht 6\' 2"  (1.88 m)   Wt (!) 163.3 kg (360  lb)   SpO2 91%   BMI 46.22 kg/m   Physical Exam  Constitutional: He is oriented to person, place, and time. He appears well-developed.  HENT:  Head: Normocephalic.  Eyes: Conjunctivae and EOM are normal. No scleral icterus.  Neck: Neck supple. No thyromegaly present.  Cardiovascular: Normal rate and regular rhythm.  Exam reveals no gallop and no friction rub.   No murmur heard. Pulmonary/Chest: No stridor. He has no wheezes. He has no rales. He exhibits no tenderness.  Abdominal: He exhibits no distension. There is  tenderness. There is no rebound.  Patient has about a 2 cm area that is tender mildly fluctuant over the suprapubic area  Musculoskeletal: Normal range of motion. He exhibits no edema.  Lymphadenopathy:    He has no cervical adenopathy.  Neurological: He is oriented to person, place, and time. He exhibits normal muscle tone. Coordination normal.  Skin: No rash noted. No erythema.  Psychiatric: He has a normal mood and affect. His behavior is normal.     ED Treatments / Results  Labs (all labs ordered are listed, but only abnormal results are displayed) Labs Reviewed  CBC WITH DIFFERENTIAL/PLATELET - Abnormal; Notable for the following:       Result Value   WBC 15.3 (*)    Neutro Abs 11.1 (*)    Monocytes Absolute 1.7 (*)    All other components within normal limits  COMPREHENSIVE METABOLIC PANEL - Abnormal; Notable for the following:    Sodium 129 (*)    Chloride 93 (*)    Glucose, Bld 513 (*)    BUN 32 (*)    Creatinine, Ser 2.50 (*)    Albumin 3.4 (*)    AST 10 (*)    ALT 12 (*)    GFR calc non Af Amer 30 (*)    GFR calc Af Amer 35 (*)    All other components within normal limits  CBG MONITORING, ED - Abnormal; Notable for the following:    Glucose-Capillary 530 (*)    All other components within normal limits  CBG MONITORING, ED - Abnormal; Notable for the following:    Glucose-Capillary 405 (*)    All other components within normal limits  CBG MONITORING, ED - Abnormal; Notable for the following:    Glucose-Capillary 419 (*)    All other components within normal limits    EKG  EKG Interpretation None       Radiology Ct Abdomen Pelvis Wo Contrast  Result Date: 08/28/2016 CLINICAL DATA:  Right lower quadrant pain EXAM: CT ABDOMEN AND PELVIS WITHOUT CONTRAST TECHNIQUE: Multidetector CT imaging of the abdomen and pelvis was performed following the standard protocol without IV contrast. COMPARISON:  None. FINDINGS: Lower chest: Lung bases are clear. No effusions.  Heart is normal size. Hepatobiliary: Diffuse low-density throughout the liver compatible with fatty infiltration. No focal abnormality. Gallbladder unremarkable. Pancreas: No focal abnormality or ductal dilatation. Spleen: No focal abnormality.  Normal size. Adrenals/Urinary Tract: No adrenal abnormality. No focal renal abnormality. No stones or hydronephrosis. Urinary bladder is unremarkable. Stomach/Bowel: Normal appendix. Stomach, large and small bowel grossly unremarkable. Vascular/Lymphatic: No evidence of aneurysm or adenopathy. Scattered aortic and iliac calcifications. Reproductive: No visible focal abnormality. Other: No free fluid or free air. Moderate-sized hiatal hernia containing fat. There is with appears to be a fluid collection in the anterior abdominal wall pannus of the lower pelvis measuring 4.4 x 4.1 cm. Locules of gas noted internally. Findings concerning for soft tissue abscess. Musculoskeletal: No  acute bony abnormality. IMPRESSION: Normal appendix. No renal or ureteral stones. Fatty liver. Subcutaneous soft tissue gas and fluid collection in the lower anterior abdominal wall measuring 4.4 x 4.1 cm concerning for soft tissue abscess. This is at the fold of the pannus. Moderate-sized umbilical hernia containing fat. Electronically Signed   By: Charlett NoseKevin  Dover M.D.   On: 08/28/2016 08:16    Procedures Procedures (including critical care time)  Medications Ordered in ED Medications  sulfamethoxazole-trimethoprim (BACTRIM DS,SEPTRA DS) 800-160 MG per tablet 1 tablet (not administered)  sodium chloride 0.9 % bolus 1,000 mL (0 mLs Intravenous Stopped 08/28/16 0938)  insulin aspart (novoLOG) injection 10 Units (10 Units Intravenous Given 08/28/16 0935)  sodium chloride 0.9 % bolus 1,000 mL (0 mLs Intravenous Stopped 08/28/16 1056)     Initial Impression / Assessment and Plan / ED Course  I have reviewed the triage vital signs and the nursing notes.  Pertinent labs & imaging results that  were available during my care of the patient were reviewed by me and considered in my medical decision making (see chart for details).     Patient has an abscess to his lower abdomen that opened on its own while in the emergency department and had significant drainage. We will increase his glipizide from 10 mg twice a day to 20 mg twice a day he will be placed on some Bactrim for his infection and will follow-up with the health department within a week  Final Clinical Impressions(s) / ED Diagnoses   Final diagnoses:  Abscess    New Prescriptions New Prescriptions   No medications on file     Bethann BerkshireZammit, Greg Eckrich, MD 08/28/16 1310

## 2016-08-28 NOTE — ED Notes (Signed)
Area under pannus draining.  Dressing applied.

## 2016-08-28 NOTE — Discharge Instructions (Signed)
Increase your glipizide to 20mg  twice a day and follow up at the health department in 3-7 days

## 2016-08-28 NOTE — ED Triage Notes (Signed)
Pt reports abscess to abdominal folds for the past 3-4 days.  States he was seen for elevated glucose a week ago and has not been able to afford medications.

## 2017-04-27 ENCOUNTER — Emergency Department (HOSPITAL_COMMUNITY): Payer: Medicaid - Out of State

## 2017-04-27 ENCOUNTER — Encounter (HOSPITAL_COMMUNITY): Payer: Self-pay | Admitting: Emergency Medicine

## 2017-04-27 ENCOUNTER — Emergency Department (HOSPITAL_COMMUNITY)
Admission: EM | Admit: 2017-04-27 | Discharge: 2017-04-27 | Disposition: A | Discharge status: Home / Self Care | Payer: Medicaid - Out of State | Attending: Emergency Medicine | Admitting: Emergency Medicine

## 2017-04-27 DIAGNOSIS — M25561 Pain in right knee: Secondary | ICD-10-CM

## 2017-04-27 DIAGNOSIS — Y999 Unspecified external cause status: Secondary | ICD-10-CM | POA: Diagnosis not present

## 2017-04-27 DIAGNOSIS — F17210 Nicotine dependence, cigarettes, uncomplicated: Secondary | ICD-10-CM | POA: Insufficient documentation

## 2017-04-27 DIAGNOSIS — I10 Essential (primary) hypertension: Secondary | ICD-10-CM | POA: Diagnosis not present

## 2017-04-27 DIAGNOSIS — X501XXA Overexertion from prolonged static or awkward postures, initial encounter: Secondary | ICD-10-CM | POA: Diagnosis not present

## 2017-04-27 DIAGNOSIS — M1711 Unilateral primary osteoarthritis, right knee: Secondary | ICD-10-CM | POA: Diagnosis not present

## 2017-04-27 DIAGNOSIS — Y929 Unspecified place or not applicable: Secondary | ICD-10-CM | POA: Diagnosis not present

## 2017-04-27 DIAGNOSIS — Y9389 Activity, other specified: Secondary | ICD-10-CM | POA: Insufficient documentation

## 2017-04-27 DIAGNOSIS — Z7984 Long term (current) use of oral hypoglycemic drugs: Secondary | ICD-10-CM | POA: Diagnosis not present

## 2017-04-27 DIAGNOSIS — E119 Type 2 diabetes mellitus without complications: Secondary | ICD-10-CM | POA: Insufficient documentation

## 2017-04-27 DIAGNOSIS — Z79899 Other long term (current) drug therapy: Secondary | ICD-10-CM | POA: Diagnosis not present

## 2017-04-27 MED ORDER — HYDROCHLOROTHIAZIDE 25 MG PO TABS
25.0000 mg | ORAL_TABLET | Freq: Every day | ORAL | Status: DC
  Administered 2017-04-27: 25 mg via ORAL
  Filled 2017-04-27: qty 1

## 2017-04-27 MED ORDER — HYDROMORPHONE HCL 1 MG/ML IJ SOLN
1.0000 mg | Freq: Once | INTRAMUSCULAR | Status: AC
  Administered 2017-04-27: 1 mg via INTRAMUSCULAR
  Filled 2017-04-27: qty 1

## 2017-04-27 MED ORDER — LISINOPRIL 10 MG PO TABS
20.0000 mg | ORAL_TABLET | Freq: Once | ORAL | Status: AC
  Administered 2017-04-27: 20 mg via ORAL
  Filled 2017-04-27: qty 2

## 2017-04-27 MED ORDER — DILTIAZEM HCL 30 MG PO TABS: 180.0000 mg | ORAL_TABLET | Freq: Once | ORAL | Status: DC

## 2017-04-27 MED ORDER — OXYCODONE-ACETAMINOPHEN 5-325 MG PO TABS: 1.0000 | ORAL_TABLET | ORAL | 0 refills | Status: DC | PRN

## 2017-04-27 MED ORDER — HYDROCODONE-ACETAMINOPHEN 5-325 MG PO TABS
1.0000 | ORAL_TABLET | Freq: Once | ORAL | Status: AC
  Administered 2017-04-27: 1 via ORAL
  Filled 2017-04-27: qty 1

## 2017-04-27 MED ORDER — DILTIAZEM HCL ER COATED BEADS 180 MG PO CP24
180.0000 mg | ORAL_CAPSULE | Freq: Every day | ORAL | Status: DC
  Administered 2017-04-27: 180 mg via ORAL
  Filled 2017-04-27 (×3): qty 1

## 2017-04-27 NOTE — ED Provider Notes (Signed)
[facility] Provider Note   CSN: [id number] Arrival date & time: 04/27/17  0825     History   Chief Complaint Chief Complaint  Patient presents with  . Knee Pain    HPI Christopher Chambers is a 42 y.o. male.  The history is provided by the patient.  Knee Pain   This is a new problem. The current episode started yesterday. The problem occurs constantly. The problem has been gradually worsening. The pain is present in the right knee. The quality of the pain is described as sharp. The pain is at a severity of 10/10. The pain is severe. Associated symptoms include limited range of motion. Pertinent negatives include no numbness. Associated symptoms comments: swelling. The symptoms are aggravated by standing and activity. Treatments tried: tylenol and ice. The treatment provided no relief. There has been a history of trauma (He was washing a car yesterday when he planted the right foot and twisted to the causing sudden onset of pain along his right lateral knee.).    Past Medical History:  Diagnosis Date  . Atrial fibrillation (HCC)   . Bipolar disorder (HCC)   . Depression   . Diabetes mellitus without complication (HCC)   . Hypertension     Patient Active Problem List   Diagnosis Date Noted  . Atrial fibrillation with rapid ventricular response (HCC) 10/07/2013  . Morbid obesity (HCC) 10/07/2013  . Sleep apnea 10/07/2013  . A-fib (HCC) 10/07/2013  . Polysubstance abuse (HCC) 09/16/2012  . Bipolar disorder (HCC)   . Depression   . Hypertension     Past Surgical History:  Procedure Laterality Date  . CARDIOVERSION          Home Medications    Prior to Admission medications   Medication Sig Start Date End Date Taking? Authorizing Provider  acetaminophen (TYLENOL) 500 MG tablet Take 1,000 mg by mouth every 6 (six) hours as needed for mild pain.   Yes [provider]  glipiZIDE (GLUCOTROL) 10 MG tablet Take 1 tablet by mouth 2 (two) times  daily. 07/23/16  Yes [provider]  JANUVIA 100 MG tablet Take 1 tablet by mouth daily. 07/23/16  Yes [provider]  lisinopril-hydrochlorothiazide (PRINZIDE,ZESTORETIC) 20-25 MG tablet Take 1 tablet by mouth daily. 07/23/16  Yes [provider]  diltiazem (CARDIZEM CD) 180 MG 24 hr capsule Take 1 capsule (180 mg total) by mouth daily. Patient not taking: Reported on 04/27/2017 10/08/13   Rhetta MuraSamtani, Jai-Gurmukh, MD  flecainide (TAMBOCOR) 50 MG tablet Take 50 mg by mouth daily.    [provider]  oxyCODONE-acetaminophen (PERCOCET/ROXICET) 5-325 MG tablet Take 1 tablet by mouth every 4 (four) hours as needed for severe pain. 04/27/17   Burgess AmorIdol, Roverto Bodmer, PA-C    Family History Family History  Problem Relation Age of Onset  . Heart failure Maternal Grandmother     Social History Social History   Tobacco Use  . Smoking status: Current Every Day Smoker    Packs/day: 0.25    Types: Cigarettes  . Smokeless tobacco: Never Used  Substance Use Topics  . Alcohol use: No  . Drug use: No     Allergies   Patient has no known allergies.   Review of Systems Review of Systems  Constitutional: Negative for fever.  Musculoskeletal: Positive for arthralgias and joint swelling. Negative for myalgias.  Neurological: Negative for weakness and numbness.     Physical Exam Updated Vital Signs BP (!) 168/105 (BP Location: Left Wrist)  Pulse 78   Temp (!) 97.4 F (36.3 C) (Oral)   Resp 18   Ht 6\' 2"  (1.88 m)   Wt (!) 158.8 kg (350 lb)   SpO2 98%   BMI 44.94 kg/m   Physical Exam  Constitutional: He appears well-developed and well-nourished.  HENT:  Head: Atraumatic.  Neck: Normal range of motion.  Cardiovascular:  Pulses equal bilaterally  Musculoskeletal: He exhibits edema and tenderness. He exhibits no deformity.       Right knee: He exhibits swelling. He exhibits no ecchymosis, no deformity, no erythema, no LCL laxity and no MCL laxity. Tenderness found.  Lateral joint line tenderness noted.  Neurological: He is alert. He has normal strength. He displays normal reflexes. No sensory deficit.  Skin: Skin is warm and dry.  Psychiatric: He has a normal mood and affect.     ED Treatments / Results  Labs (all labs ordered are listed, but only abnormal results are displayed) Labs Reviewed - No data to display  EKG None  Radiology Dg Knee Complete 4 Views Right  Result Date: 04/27/2017 CLINICAL DATA:  Extreme right knee pain after car washing yesterday. Swelling. EXAM: RIGHT KNEE - COMPLETE 4+ VIEW COMPARISON:  11/14/2013. FINDINGS: Progressive spur formation involving all 3 joint compartments with progressive joint space narrowing involving all 3 joint compartments. No effusion or fracture seen. IMPRESSION: Progressive tricompartmental degenerative changes. No acute abnormality. Electronically Signed   By: Beckie Salts M.D.   On: 04/27/2017 09:48    Procedures Procedures (including critical care time)  Medications Ordered in ED Medications  HYDROmorphone (DILAUDID) injection 1 mg (has no administration in time range)  lisinopril (PRINIVIL,ZESTRIL) tablet 20 mg (has no administration in time range)  hydrochlorothiazide (HYDRODIURIL) tablet 25 mg (has no administration in time range)  diltiazem (CARDIZEM CD) 24 hr capsule 180 mg (has no administration in time range)  HYDROcodone-acetaminophen (NORCO/VICODIN) 5-325 MG per tablet 1 tablet (1 tablet Oral Given 04/27/17 0844)     Initial Impression / Assessment and Plan / ED Course  I have reviewed the triage vital signs and the nursing notes.  Pertinent labs & imaging results that were available during my care of the patient were reviewed by me and considered in my medical decision making (see chart for details).     X-rays were reviewed, discussed and shown to patient and his wife.  Suspect soft tissue injury, possible a ligament injury or meniscal injury although is and there is no  palpable deformity appreciated.  He does have some mild edema at the lateral anterior knee joint.  Pain with valgus, varus strain but also with attempts at drawer test which he did not tolerate well.  There was no obvious weakness in the joint with these movements.  He was placed in an Ace wrap, crutches provided.  Discussed ice and elevation, weightbearing as tolerated.  Referral to Dr. Romeo Apple for further evaluation and management.  Discussed elevated blood pressure.  He has not had his morning medications.  His a.m. BP medicines were given here, advised to take his other medications once home.  Final Clinical Impressions(s) / ED Diagnoses   Final diagnoses:  Acute pain of right knee  Osteoarthritis of right knee, unspecified osteoarthritis type    ED Discharge Orders        Ordered    oxyCODONE-acetaminophen (PERCOCET/ROXICET) 5-325 MG tablet  Every 4 hours PRN     04/27/17 1100       Burgess Amor, PA-C 04/27/17 1109  Eber Hong, MD 04/28/17 2123

## 2017-04-27 NOTE — Discharge Instructions (Addendum)
Your knee xrays show advanced arthritis in your knee but no acute injury.  I suspect you may have injured a ligament in your knee with yesterday's movement, but this will not show up on an xray.  Use ice as much as possible for pain and swelling, elevate your knee, keep it wrapped also for swelling and pain relief and minimize weight bearing. Call Dr. Romeo AppleHarrison for further evaluation and management.  You may take the percocet prescribed for pain relief.  This will make you drowsy - do not drive within 4 hours of taking this medication.  Your blood pressure is elevated today, probably due to pain and not taking your morning medicines.  You have been given your blood pressure medicines diltiazem and lisinopril-hydrochlorothiazide this morning.  Make sure you take your regular medicines today.

## 2017-04-27 NOTE — ED Triage Notes (Signed)
Pt states his right knee has been hurting since he turned to the right while washing a car yesterday.

## 2017-07-16 ENCOUNTER — Encounter (HOSPITAL_COMMUNITY): Payer: Self-pay | Admitting: *Deleted

## 2017-07-16 ENCOUNTER — Other Ambulatory Visit: Payer: Self-pay

## 2017-07-16 ENCOUNTER — Emergency Department (HOSPITAL_COMMUNITY)
Admission: EM | Admit: 2017-07-16 | Discharge: 2017-07-16 | Disposition: A | Discharge status: Home / Self Care | Payer: Medicaid - Out of State | Attending: Emergency Medicine | Admitting: Emergency Medicine

## 2017-07-16 DIAGNOSIS — E860 Dehydration: Secondary | ICD-10-CM | POA: Insufficient documentation

## 2017-07-16 DIAGNOSIS — Z79899 Other long term (current) drug therapy: Secondary | ICD-10-CM | POA: Diagnosis not present

## 2017-07-16 DIAGNOSIS — L0291 Cutaneous abscess, unspecified: Secondary | ICD-10-CM

## 2017-07-16 DIAGNOSIS — I10 Essential (primary) hypertension: Secondary | ICD-10-CM | POA: Diagnosis not present

## 2017-07-16 DIAGNOSIS — R739 Hyperglycemia, unspecified: Secondary | ICD-10-CM

## 2017-07-16 DIAGNOSIS — N289 Disorder of kidney and ureter, unspecified: Secondary | ICD-10-CM | POA: Diagnosis not present

## 2017-07-16 DIAGNOSIS — L02415 Cutaneous abscess of right lower limb: Secondary | ICD-10-CM | POA: Diagnosis not present

## 2017-07-16 DIAGNOSIS — F17210 Nicotine dependence, cigarettes, uncomplicated: Secondary | ICD-10-CM | POA: Insufficient documentation

## 2017-07-16 DIAGNOSIS — E1165 Type 2 diabetes mellitus with hyperglycemia: Secondary | ICD-10-CM | POA: Insufficient documentation

## 2017-07-16 DIAGNOSIS — Z7984 Long term (current) use of oral hypoglycemic drugs: Secondary | ICD-10-CM | POA: Insufficient documentation

## 2017-07-16 LAB — URINALYSIS, ROUTINE W REFLEX MICROSCOPIC
BILIRUBIN URINE: NEGATIVE
Bacteria, UA: NONE SEEN
Glucose, UA: >=500 mg/dL — AB
HGB URINE DIPSTICK: NEGATIVE
Ketones, ur: NEGATIVE mg/dL
Leukocytes, UA: NEGATIVE
Nitrite: NEGATIVE
Protein, ur: NEGATIVE mg/dL
Specific Gravity, Urine: 1.02 (ref 1.005–1.030)
pH: 6 (ref 5.0–8.0)

## 2017-07-16 LAB — CBG MONITORING, ED
Glucose-Capillary: >600 mg/dL (ref 65–99)
Glucose-Capillary: 470 mg/dL — ABNORMAL HIGH (ref 65–99)
Glucose-Capillary: 325 mg/dL — ABNORMAL HIGH (ref 65–99)

## 2017-07-16 LAB — I-STAT CHEM 8, ED
BUN: 24 mg/dL — ABNORMAL HIGH (ref 6–20)
CALCIUM ION: 1.15 mmol/L (ref 1.15–1.40)
CREATININE: 2 mg/dL — AB (ref 0.61–1.24)
Chloride: 97 mmol/L — ABNORMAL LOW (ref 101–111)
GLUCOSE: 314 mg/dL — AB (ref 65–99)
HEMATOCRIT: 39 % (ref 39.0–52.0)
HEMOGLOBIN: 13.3 g/dL (ref 13.0–17.0)
Potassium: 3.1 mmol/L — ABNORMAL LOW (ref 3.5–5.1)
Sodium: 136 mmol/L (ref 135–145)
TCO2: 25 mmol/L (ref 22–32)

## 2017-07-16 LAB — BASIC METABOLIC PANEL
ANION GAP: 10 (ref 5–15)
BUN: 26 mg/dL — ABNORMAL HIGH (ref 6–20)
CALCIUM: 9.2 mg/dL (ref 8.9–10.3)
CHLORIDE: 88 mmol/L — AB (ref 101–111)
CO2: 30 mmol/L (ref 22–32)
Creatinine, Ser: 2.36 mg/dL — ABNORMAL HIGH (ref 0.61–1.24)
GFR calc non Af Amer: 32 mL/min — ABNORMAL LOW (ref >60)
GFR, EST AFRICAN AMERICAN: 37 mL/min — AB (ref >60)
Glucose, Bld: 699 mg/dL (ref 65–99)
Potassium: 3.9 mmol/L (ref 3.5–5.1)
Sodium: 128 mmol/L — ABNORMAL LOW (ref 135–145)

## 2017-07-16 LAB — CBC
HCT: 42.5 % (ref 39.0–52.0)
HEMOGLOBIN: 14.8 g/dL (ref 13.0–17.0)
MCH: 29.5 pg (ref 26.0–34.0)
MCHC: 34.8 g/dL (ref 30.0–36.0)
MCV: 84.8 fL (ref 78.0–100.0)
Platelets: 244 10*3/uL (ref 150–400)
RBC: 5.01 MIL/uL (ref 4.22–5.81)
RDW: 13.1 % (ref 11.5–15.5)
WBC: 13.9 10*3/uL — AB (ref 4.0–10.5)

## 2017-07-16 MED ORDER — SODIUM CHLORIDE 0.9 % IV SOLN
INTRAVENOUS | Status: DC
  Administered 2017-07-16: 5.4 [IU]/h via INTRAVENOUS
  Filled 2017-07-16: qty 1

## 2017-07-16 MED ORDER — POTASSIUM CHLORIDE 10 MEQ/100ML IV SOLN
10.0000 meq | INTRAVENOUS | Status: AC
  Administered 2017-07-16 (×2): 10 meq via INTRAVENOUS
  Filled 2017-07-16 (×2): qty 100

## 2017-07-16 MED ORDER — INSULIN REGULAR HUMAN 100 UNIT/ML IJ SOLN
INTRAMUSCULAR | Status: AC
  Filled 2017-07-16: qty 1

## 2017-07-16 MED ORDER — DOXYCYCLINE HYCLATE 100 MG PO TABS
100.0000 mg | ORAL_TABLET | Freq: Once | ORAL | Status: AC
  Administered 2017-07-16: 100 mg via ORAL
  Filled 2017-07-16: qty 1

## 2017-07-16 MED ORDER — SODIUM CHLORIDE 0.9 % IV BOLUS
1000.0000 mL | Freq: Once | INTRAVENOUS | Status: AC
  Administered 2017-07-16: 1000 mL via INTRAVENOUS

## 2017-07-16 MED ORDER — DOXYCYCLINE HYCLATE 100 MG PO CAPS: 100.0000 mg | ORAL_CAPSULE | Freq: Two times a day (BID) | ORAL | 0 refills | Status: DC

## 2017-07-16 NOTE — ED Notes (Signed)
Date and time results received: 07/16/17 2:20 AM  (use smartphrase ".now" to insert current time)  Test: glucose Critical Value: 699  Name of Provider Notified: Fayrene FearingJames, NP student/[place]  Orders Received? Or Actions Taken?: n/a

## 2017-07-16 NOTE — ED Triage Notes (Signed)
Pt states his cbg machine readings have been measuring high for over a week; pt also c/o abscess in the groin area and numbness and tingling to right fingers and forearm

## 2017-07-16 NOTE — ED Provider Notes (Signed)
[facility] EMERGENCY DEPARTMENT Provider Note   CSN: [id number] Arrival date & time: 07/16/17  0106     History   Chief Complaint Chief Complaint  Patient presents with  . Hyperglycemia    Pt seen with NP student, I performed history/physical/documentation HPI Christopher Chambers is a 42 y.o. male.  The history is provided by the patient.  Hyperglycemia  Severity:  Severe Onset quality:  Gradual Timing:  Constant Progression:  Worsening Chronicity:  New Relieved by:  Nothing Ineffective treatments:  None tried Associated symptoms: blurred vision, dehydration, fatigue, increased thirst, nausea, polyuria, weakness and weight change   Associated symptoms: no abdominal pain, no chest pain, no fever, no shortness of breath and no vomiting    Reports several days of feeling fatigued and weak.  He reports his glucose is been elevated.  He reports nausea, polyuria, polydipsia.  He also reports an abscess in his groin.  He also reports tingling numbness in his right hand. No chest pain or shortness of breath Past Medical History:  Diagnosis Date  . Atrial fibrillation (HCC)   . Bipolar disorder (HCC)   . Depression   . Diabetes mellitus without complication (HCC)   . Hypertension     Patient Active Problem List   Diagnosis Date Noted  . Atrial fibrillation with rapid ventricular response (HCC) 10/07/2013  . Morbid obesity (HCC) 10/07/2013  . Sleep apnea 10/07/2013  . A-fib (HCC) 10/07/2013  . Polysubstance abuse (HCC) 09/16/2012  . Bipolar disorder (HCC)   . Depression   . Hypertension     Past Surgical History:  Procedure Laterality Date  . CARDIOVERSION          Home Medications    Prior to Admission medications   Medication Sig Start Date End Date Taking? Authorizing Provider  acetaminophen (TYLENOL) 500 MG tablet Take 1,000 mg by mouth every 6 (six) hours as needed for mild pain.    [provider]  diltiazem (CARDIZEM CD) 180 MG 24 hr capsule Take  1 capsule (180 mg total) by mouth daily. Patient not taking: Reported on 04/27/2017 10/08/13   Rhetta Mura, MD  flecainide (TAMBOCOR) 50 MG tablet Take 50 mg by mouth daily.    [provider]  glipiZIDE (GLUCOTROL) 10 MG tablet Take 1 tablet by mouth 2 (two) times daily. 07/23/16   [provider]  JANUVIA 100 MG tablet Take 1 tablet by mouth daily. 07/23/16   [provider]  lisinopril-hydrochlorothiazide (PRINZIDE,ZESTORETIC) 20-25 MG tablet Take 1 tablet by mouth daily. 07/23/16   [provider]  oxyCODONE-acetaminophen (PERCOCET/ROXICET) 5-325 MG tablet Take 1 tablet by mouth every 4 (four) hours as needed for severe pain. 04/27/17   Burgess Amor, PA-C    Family History Family History  Problem Relation Age of Onset  . Heart failure Maternal Grandmother     Social History Social History   Tobacco Use  . Smoking status: Current Every Day Smoker    Packs/day: 0.25    Types: Cigarettes  . Smokeless tobacco: Never Used  Substance Use Topics  . Alcohol use: No  . Drug use: No     Allergies   Patient has no known allergies.   Review of Systems Review of Systems  Constitutional: Positive for fatigue. Negative for fever.  Eyes: Positive for blurred vision.  Respiratory: Negative for shortness of breath.   Cardiovascular: Negative for chest pain.  Gastrointestinal: Positive for nausea. Negative for abdominal pain and vomiting.  Endocrine: Positive for polydipsia and polyuria.  Skin: Positive for wound.  Neurological: Positive for weakness.  All other systems reviewed and are negative.    Physical Exam Updated Vital Signs BP (!) 155/92 (BP Location: Left Arm)   Pulse 83   Temp 97.8 F (36.6 C) (Oral)   Resp 18   Ht 1.88 m (6\' 2" )   Wt (!) 148.3 kg (327 lb)   SpO2 97%   BMI 41.98 kg/m   Physical Exam CONSTITUTIONAL: Well developed/well nourished, appears fatigued, does not smell ketones HEAD: Normocephalic/atraumatic EYES:  EOMI/PERRL ENMT: Mucous membranes dry NECK: supple no meningeal signs SPINE/BACK:entire spine nontender CV: S1/S2 noted, no murmurs/rubs/gallops noted LUNGS: Lungs are clear to auscultation bilaterally, no apparent distress ABDOMEN: soft, nontender, no rebound or guarding, bowel sounds noted throughout abdomen, obese GU:no cva tenderness NEURO: Pt is awake/alert/appropriate, moves all extremitiesx4.  No facial droop.   EXTREMITIES: pulses normal/equal, full ROM SKIN: warm, color normal, area of induration and small abscess to right medial proximal thigh, no crepitus, does not extend into perineum.  Chaperone present for exam PSYCH: no abnormalities of mood noted, alert and oriented to situation   ED Treatments / Results  Labs (all labs ordered are listed, but only abnormal results are displayed) Labs Reviewed  BASIC METABOLIC PANEL - Abnormal; Notable for the following components:      Result Value   Sodium 128 (*)    Chloride 88 (*)    Glucose, Bld 699 (*)    BUN 26 (*)    Creatinine, Ser 2.36 (*)    GFR calc non Af Amer 32 (*)    GFR calc Af Amer 37 (*)    All other components within normal limits  CBC - Abnormal; Notable for the following components:   WBC 13.9 (*)    All other components within normal limits  URINALYSIS, ROUTINE W REFLEX MICROSCOPIC - Abnormal; Notable for the following components:   Color, Urine STRAW (*)    Glucose, UA >=500 (*)    All other components within normal limits  CBG MONITORING, ED - Abnormal; Notable for the following components:   Glucose-Capillary >600 (*)    All other components within normal limits  CBG MONITORING, ED - Abnormal; Notable for the following components:   Glucose-Capillary >600 (*)    All other components within normal limits  CBG MONITORING, ED - Abnormal; Notable for the following components:   Glucose-Capillary 470 (*)    All other components within normal limits  I-STAT CHEM 8, ED - Abnormal; Notable for the  following components:   Potassium 3.1 (*)    Chloride 97 (*)    BUN 24 (*)    Creatinine, Ser 2.00 (*)    Glucose, Bld 314 (*)    All other components within normal limits    EKG EKG Interpretation  Date/Time:  Wednesday July 16 2017 02:56:08 EDT Ventricular Rate:  82 PR Interval:    QRS Duration: 102 QT Interval:  411 QTC Calculation: 480 R Axis:   -2 Text Interpretation:  Sinus rhythm Probable left atrial enlargement Borderline T abnormalities, diffuse leads Borderline prolonged QT interval Confirmed by Zadie Rhine ([id number]) on 07/16/2017 3:39:25 AM   Radiology No results found.  Procedures Procedures    CRITICAL CARE Performed by: Joya Gaskins Total critical care time: 40 minutes Critical care time was exclusive of separately billable procedures and treating other patients. Critical care was necessary to treat or prevent imminent or life-threatening deterioration. Critical care was time spent personally by me on  the following activities: development of treatment plan with patient and/or surrogate as well as nursing, discussions with consultants, evaluation of patient's response to treatment, examination of patient, obtaining history from patient or surrogate, ordering and performing treatments and interventions, ordering and review of laboratory studies, ordering and review of radiographic studies, pulse oximetry and re-evaluation of patient's condition. Patient with glucose greater 600 and necessitating IV fluids and IV insulin. Medications Ordered in ED Medications  insulin regular (NOVOLIN R,HUMULIN R) 100 Units in sodium chloride 0.9 % 100 mL (1 Units/mL) infusion (0 Units/hr Intravenous Stopped 07/16/17 0643)  sodium chloride 0.9 % bolus 1,000 mL (0 mLs Intravenous Stopped 07/16/17 0352)  sodium chloride 0.9 % bolus 1,000 mL (0 mLs Intravenous Stopped 07/16/17 0506)  potassium chloride 10 mEq in 100 mL IVPB (0 mEq Intravenous Stopped 07/16/17 0510)  doxycycline  (VIBRA-TABS) tablet 100 mg (100 mg Oral Given 07/16/17 0315)     Initial Impression / Assessment and Plan / ED Course  I have reviewed the triage vital signs and the nursing notes.  Pertinent labs results that were available during my care of the patient were reviewed by me and considered in my medical decision making (see chart for details).     3:42 AM Patient with increasing fatigue, with hyperglycemia.  No anion gap to suggest DKA.  He also has an early abscess on his medial thigh, but he refuses I&D at this time we will start antibiotics  He prefers not to be admitted, would like to be treated in the emergency department and discharged 5:19 AM Glucose is improving, patient resting comfortably.  We will continue to monitor 6:53 AM Improved, he is awake alert.  Glucose is improved.  Renal function is also improved.  Will discharge home with doxycycline. Final Clinical Impressions(s) / ED Diagnoses   Final diagnoses:  Hyperglycemia  Dehydration  Renal insufficiency  Abscess    ED Discharge Orders    None       Zadie RhineWickline, Isha Seefeld, MD 07/16/17 [phone]

## 2018-03-27 ENCOUNTER — Other Ambulatory Visit: Payer: Self-pay

## 2018-03-27 ENCOUNTER — Emergency Department (HOSPITAL_COMMUNITY)
Admission: EM | Admit: 2018-03-27 | Discharge: 2018-03-27 | Disposition: A | Discharge status: Home / Self Care | Payer: Medicaid Other | Attending: Emergency Medicine | Admitting: Emergency Medicine

## 2018-03-27 ENCOUNTER — Encounter (HOSPITAL_COMMUNITY): Payer: Self-pay | Admitting: Emergency Medicine

## 2018-03-27 DIAGNOSIS — F17210 Nicotine dependence, cigarettes, uncomplicated: Secondary | ICD-10-CM | POA: Diagnosis not present

## 2018-03-27 DIAGNOSIS — L089 Local infection of the skin and subcutaneous tissue, unspecified: Secondary | ICD-10-CM | POA: Diagnosis not present

## 2018-03-27 DIAGNOSIS — Z794 Long term (current) use of insulin: Secondary | ICD-10-CM | POA: Diagnosis not present

## 2018-03-27 DIAGNOSIS — R739 Hyperglycemia, unspecified: Secondary | ICD-10-CM

## 2018-03-27 DIAGNOSIS — R531 Weakness: Secondary | ICD-10-CM | POA: Diagnosis present

## 2018-03-27 DIAGNOSIS — I10 Essential (primary) hypertension: Secondary | ICD-10-CM | POA: Insufficient documentation

## 2018-03-27 DIAGNOSIS — Z79899 Other long term (current) drug therapy: Secondary | ICD-10-CM | POA: Diagnosis not present

## 2018-03-27 DIAGNOSIS — E669 Obesity, unspecified: Secondary | ICD-10-CM | POA: Insufficient documentation

## 2018-03-27 DIAGNOSIS — E1165 Type 2 diabetes mellitus with hyperglycemia: Secondary | ICD-10-CM | POA: Insufficient documentation

## 2018-03-27 DIAGNOSIS — L0291 Cutaneous abscess, unspecified: Secondary | ICD-10-CM

## 2018-03-27 LAB — CBC WITH DIFFERENTIAL/PLATELET
ABS IMMATURE GRANULOCYTES: 0.05 10*3/uL (ref 0.00–0.07)
Basophils Absolute: 0.1 10*3/uL (ref 0.0–0.1)
Basophils Relative: 0 %
Eosinophils Absolute: 0.2 10*3/uL (ref 0.0–0.5)
Eosinophils Relative: 1 %
HEMATOCRIT: 44 % (ref 39.0–52.0)
HEMOGLOBIN: 14.5 g/dL (ref 13.0–17.0)
IMMATURE GRANULOCYTES: 0 %
LYMPHS ABS: 2.4 10*3/uL (ref 0.7–4.0)
LYMPHS PCT: 18 %
MCH: 28.2 pg (ref 26.0–34.0)
MCHC: 33 g/dL (ref 30.0–36.0)
MCV: 85.6 fL (ref 80.0–100.0)
Monocytes Absolute: 1 10*3/uL (ref 0.1–1.0)
Monocytes Relative: 7 %
NEUTROS ABS: 10 10*3/uL — AB (ref 1.7–7.7)
NEUTROS PCT: 74 %
Platelets: 272 10*3/uL (ref 150–400)
RBC: 5.14 MIL/uL (ref 4.22–5.81)
RDW: 12.9 % (ref 11.5–15.5)
WBC: 13.7 10*3/uL — ABNORMAL HIGH (ref 4.0–10.5)
nRBC: 0 % (ref 0.0–0.2)

## 2018-03-27 LAB — COMPREHENSIVE METABOLIC PANEL
ALT: 10 U/L (ref 0–44)
ANION GAP: 9 (ref 5–15)
AST: 11 U/L — AB (ref 15–41)
Albumin: 3.7 g/dL (ref 3.5–5.0)
Alkaline Phosphatase: 122 U/L (ref 38–126)
BUN: 37 mg/dL — ABNORMAL HIGH (ref 6–20)
CO2: 26 mmol/L (ref 22–32)
Calcium: 8.7 mg/dL — ABNORMAL LOW (ref 8.9–10.3)
Chloride: 93 mmol/L — ABNORMAL LOW (ref 98–111)
Creatinine, Ser: 2.16 mg/dL — ABNORMAL HIGH (ref 0.61–1.24)
GFR calc Af Amer: 42 mL/min — ABNORMAL LOW (ref >60)
GFR calc non Af Amer: 36 mL/min — ABNORMAL LOW (ref >60)
GLUCOSE: 557 mg/dL — AB (ref 70–99)
POTASSIUM: 4.6 mmol/L (ref 3.5–5.1)
SODIUM: 128 mmol/L — AB (ref 135–145)
Total Bilirubin: 0.3 mg/dL (ref 0.3–1.2)
Total Protein: 7.8 g/dL (ref 6.5–8.1)

## 2018-03-27 LAB — CBG MONITORING, ED
GLUCOSE-CAPILLARY: 523 mg/dL — AB (ref 70–99)
Glucose-Capillary: 387 mg/dL — ABNORMAL HIGH (ref 70–99)
Glucose-Capillary: 339 mg/dL — ABNORMAL HIGH (ref 70–99)

## 2018-03-27 LAB — URINALYSIS, ROUTINE W REFLEX MICROSCOPIC
BILIRUBIN URINE: NEGATIVE
Bacteria, UA: NONE SEEN
Glucose, UA: >=500 mg/dL — AB
HGB URINE DIPSTICK: NEGATIVE
Ketones, ur: NEGATIVE mg/dL
Leukocytes,Ua: NEGATIVE
NITRITE: NEGATIVE
PH: 5 (ref 5.0–8.0)
Protein, ur: NEGATIVE mg/dL
Specific Gravity, Urine: 1.02 (ref 1.005–1.030)

## 2018-03-27 MED ORDER — SODIUM CHLORIDE 0.9 % IV BOLUS
2000.0000 mL | Freq: Once | INTRAVENOUS | Status: AC
Start: 2018-03-27 — End: 2018-03-27
  Administered 2018-03-27: 2000 mL via INTRAVENOUS

## 2018-03-27 MED ORDER — HYDROMORPHONE HCL 1 MG/ML IJ SOLN: 1.0000 mg | Freq: Once | INTRAMUSCULAR | Status: DC

## 2018-03-27 MED ORDER — SULFAMETHOXAZOLE-TRIMETHOPRIM 800-160 MG PO TABS: 1.0000 | ORAL_TABLET | Freq: Two times a day (BID) | ORAL | 0 refills | Status: DC

## 2018-03-27 MED ORDER — INSULIN REGULAR(HUMAN) IN NACL 100-0.9 UT/100ML-% IV SOLN
INTRAVENOUS | Status: DC
  Administered 2018-03-27: 3.3 [IU]/h via INTRAVENOUS
  Filled 2018-03-27: qty 100

## 2018-03-27 MED ORDER — DEXTROSE-NACL 5-0.45 % IV SOLN: INTRAVENOUS | Status: DC

## 2018-03-27 NOTE — ED Notes (Signed)
Ok to eat per EDP . Low Carb.

## 2018-03-27 NOTE — Discharge Instructions (Addendum)
Try to drink 2 L of water each day.  Watch your carbohydrate and sugar intake, to keep your sugar down.  See the attached instructions to help with diet control of diabetes.  Call your doctor for follow-up appointment as soon as possible.

## 2018-03-27 NOTE — ED Provider Notes (Addendum)
[facility] EMERGENCY DEPARTMENT Provider Note   CSN: [id number] Arrival date & time: 03/27/18  1624    History   Chief Complaint Chief Complaint  Patient presents with  . Hyperglycemia    HPI Christopher Chambers is a 43 y.o. male.     HPI   He complains of generalized achiness with urinary frequency for about a week.  He also has had some sores on his right axilla and left thigh, both of which have "burst and drained of pus."  Denies fever, chills, nausea, vomiting, focal weakness or paresthesia.  He states that he is taking his usual medication and follow-up with his PCP about a week ago.  There are no other known modifying factors.  Past Medical History:  Diagnosis Date  . Atrial fibrillation (HCC)   . Bipolar disorder (HCC)   . Depression   . Diabetes mellitus without complication (HCC)   . Hypertension     Patient Active Problem List   Diagnosis Date Noted  . Atrial fibrillation with rapid ventricular response (HCC) 10/07/2013  . Morbid obesity (HCC) 10/07/2013  . Sleep apnea 10/07/2013  . A-fib (HCC) 10/07/2013  . Polysubstance abuse (HCC) 09/16/2012  . Bipolar disorder (HCC)   . Depression   . Hypertension     Past Surgical History:  Procedure Laterality Date  . CARDIOVERSION          Home Medications    Prior to Admission medications   Medication Sig Start Date End Date Taking? Authorizing Provider  acetaminophen (TYLENOL) 500 MG tablet Take 1,000 mg by mouth every 6 (six) hours as needed for mild pain.   Yes [provider]  amLODIPine Besylate (NORVASC PO) Take 1 tablet by mouth daily.   Yes [provider]  glipiZIDE (GLUCOTROL) 10 MG tablet Take 20 mg by mouth 2 (two) times daily.  07/23/16  Yes [provider]  JANUVIA 100 MG tablet Take 1 tablet by mouth daily. 07/23/16  Yes [provider]  LANTUS 100 UNIT/ML injection Inject 25 Units into the skin 2 (two) times daily. 03/04/18  Yes [provider]    lisinopril-hydrochlorothiazide (PRINZIDE,ZESTORETIC) 20-25 MG tablet Take 1 tablet by mouth daily. 07/23/16  Yes [provider]  pantoprazole (PROTONIX) 40 MG tablet Take 40 mg by mouth daily as needed. For GERD/ acid reflux 01/07/18  Yes [provider]  diltiazem (CARDIZEM CD) 180 MG 24 hr capsule Take 1 capsule (180 mg total) by mouth daily. Patient not taking: Reported on 04/27/2017 10/08/13   Rhetta Mura, MD  sulfamethoxazole-trimethoprim (BACTRIM DS,SEPTRA DS) 800-160 MG tablet Take 1 tablet by mouth 2 (two) times daily. 03/27/18   Mancel Bale, MD  XARELTO 20 MG TABS tablet Take 20 mg by mouth daily. 02/27/18   [provider]    Family History Family History  Problem Relation Age of Onset  . Heart failure Maternal Grandmother     Social History Social History   Tobacco Use  . Smoking status: Current Every Day Smoker    Packs/day: 0.25    Types: Cigarettes  . Smokeless tobacco: Never Used  Substance Use Topics  . Alcohol use: No  . Drug use: No     Allergies   Patient has no known allergies.   Review of Systems Review of Systems  All other systems reviewed and are negative.    Physical Exam Updated Vital Signs BP 115/65 (BP Location: Right Arm)   Pulse 72   Temp 98.3 F (36.8 C) (Oral)  Resp 18   Ht 6\' 2"  (1.88 m)   Wt 115.7 kg   SpO2 99%   BMI 32.74 kg/m   Physical Exam Vitals signs and nursing note reviewed.  Constitutional:      General: He is not in acute distress.    Appearance: He is well-developed. He is obese. He is not ill-appearing, toxic-appearing or diaphoretic.  HENT:     Head: Normocephalic and atraumatic.     Right Ear: External ear normal.     Left Ear: External ear normal.     Nose: No congestion or rhinorrhea.     Mouth/Throat:     Mouth: Mucous membranes are moist.     Pharynx: No oropharyngeal exudate.  Eyes:     Conjunctiva/sclera: Conjunctivae normal.     Pupils: Pupils are equal, round,  and reactive to light.  Neck:     Musculoskeletal: Normal range of motion and neck supple.     Trachea: Phonation normal.  Cardiovascular:     Rate and Rhythm: Normal rate and regular rhythm.     Heart sounds: Normal heart sounds.  Pulmonary:     Effort: Pulmonary effort is normal.     Breath sounds: Normal breath sounds.  Abdominal:     Palpations: Abdomen is soft.     Tenderness: There is no abdominal tenderness.  Musculoskeletal: Normal range of motion.  Skin:    General: Skin is warm and dry.  Neurological:     Mental Status: He is alert and oriented to person, place, and time.     Cranial Nerves: No cranial nerve deficit.     Sensory: No sensory deficit.     Motor: No abnormal muscle tone.     Coordination: Coordination normal.  Psychiatric:        Mood and Affect: Mood normal.        Behavior: Behavior normal.        Thought Content: Thought content normal.        Judgment: Judgment normal.      ED Treatments / Results  Labs (all labs ordered are listed, but only abnormal results are displayed) Labs Reviewed  COMPREHENSIVE METABOLIC PANEL - Abnormal; Notable for the following components:      Result Value   Sodium 128 (*)    Chloride 93 (*)    Glucose, Bld 557 (*)    BUN 37 (*)    Creatinine, Ser 2.16 (*)    Calcium 8.7 (*)    AST 11 (*)    GFR calc non Af Amer 36 (*)    GFR calc Af Amer 42 (*)    All other components within normal limits  CBC WITH DIFFERENTIAL/PLATELET - Abnormal; Notable for the following components:   WBC 13.7 (*)    Neutro Abs 10.0 (*)    All other components within normal limits  URINALYSIS, ROUTINE W REFLEX MICROSCOPIC - Abnormal; Notable for the following components:   Color, Urine STRAW (*)    Glucose, UA >=500 (*)    All other components within normal limits  CBG MONITORING, ED - Abnormal; Notable for the following components:   Glucose-Capillary 523 (*)    All other components within normal limits  CBG MONITORING, ED -  Abnormal; Notable for the following components:   Glucose-Capillary 387 (*)    All other components within normal limits  CBG MONITORING, ED - Abnormal; Notable for the following components:   Glucose-Capillary 339 (*)    All other components within normal  limits    EKG None  Radiology No results found.  Procedures .Critical Care Performed by: Mancel BaleWentz, Zadia Uhde, MD Authorized by: Mancel BaleWentz, Joe Tanney, MD   Critical care provider statement:    Critical care time (minutes):  35   Critical care start time:  03/27/2018 7:10 PM   Critical care end time:  03/27/2018 9:55 PM   Critical care time was exclusive of:  Separately billable procedures and treating other patients   Critical care was necessary to treat or prevent imminent or life-threatening deterioration of the following conditions:  Endocrine crisis   Critical care was time spent personally by me on the following activities:  Blood draw for specimens, development of treatment plan with patient or surrogate, discussions with consultants, evaluation of patient's response to treatment, examination of patient, obtaining history from patient or surrogate, ordering and performing treatments and interventions, ordering and review of laboratory studies, pulse oximetry, re-evaluation of patient's condition, review of old charts and ordering and review of radiographic studies   (including critical care time)  Medications Ordered in ED Medications  dextrose 5 %-0.45 % sodium chloride infusion ( Intravenous Not Given 03/27/18 2148)  insulin regular, human (MYXREDLIN) 100 units/ 100 mL infusion (0 Units/hr Intravenous Stopped 03/27/18 2148)  sodium chloride 0.9 % bolus 2,000 mL (0 mLs Intravenous Stopped 03/27/18 2038)     Initial Impression / Assessment and Plan / ED Course  I have reviewed the triage vital signs and the nursing notes.  Pertinent labs & imaging results that were available during my care of the patient were reviewed by me and  considered in my medical decision making (see chart for details).         Patient Vitals for the past 24 hrs:  BP Temp Temp src Pulse Resp SpO2 Height Weight  03/27/18 2000 115/65 - - 72 18 99 % - -  03/27/18 1724 - - - - - - 6\' 2"  (1.88 m) 115.7 kg  03/27/18 1721 134/82 98.3 F (36.8 C) Oral 81 16 98 % - -    9:49 PM Reevaluation with update and discussion. After initial assessment and treatment, an updated evaluation reveals no change in clinical status.  He is tolerating food, and oral liquids.  Findings discussed with patient and friend, questions answered. Mancel BaleElliott Loeta Herst   Medical Decision Making: Hyperglycemia with mild right axillary infection, spontaneously draining, without persistent abscess.  Doubt metabolic instability or impending vascular collapse.  CRITICAL CARE- yes  Nursing Notes Reviewed/ Care Coordinated Applicable Imaging Reviewed Interpretation of Laboratory Data incorporated into ED treatment  The patient appears reasonably screened and/or stabilized for discharge and I doubt any other medical condition or other [facility] requiring further screening, evaluation, or treatment in the ED at this time prior to discharge.  Plan: Home Medications-continue current medication; Home Treatments-warm compress to right axilla with daily wound cleansing; return here if the recommended treatment, does not improve the symptoms; Recommended follow up-PCP follow-up 1 week and as needed  Performed by: Mancel BaleElliott Lashanna Angelo    Final Clinical Impressions(s) / ED Diagnoses   Final diagnoses:  Hyperglycemia  Abscess    ED Discharge Orders         Ordered    sulfamethoxazole-trimethoprim (BACTRIM DS,SEPTRA DS) 800-160 MG tablet  2 times daily     03/27/18 2144           Mancel BaleWentz, Tashema Tiller, MD 03/27/18 2151    Mancel BaleWentz, Nicolina Hirt, MD 04/04/18 1227

## 2018-03-27 NOTE — ED Notes (Signed)
CRITICAL VALUE ALERT  Critical Value:  Glucose 557  Date & Time Notied:  03/27/2018, 1836  Provider Notified: EDP  Orders Received/Actions taken: no new oders, back taken to a room from waiting room

## 2018-03-27 NOTE — ED Triage Notes (Signed)
PT states increased CBG over the past few days with abscess to right axilla with no drainage. CBG 523 upon arrival to ED. PT c/o increased urinary frequency and no decrease in CBG with po medications and lantus.

## 2019-05-22 IMAGING — DX DG KNEE COMPLETE 4+V*R*
4 series · 4 of 4 positions shown · non-contrast
Comparison: 11/14/2013.

CLINICAL DATA: Extreme right knee pain after car washing yesterday.
Swelling.

EXAM:
RIGHT KNEE - COMPLETE 4+ VIEW

[knee ap]
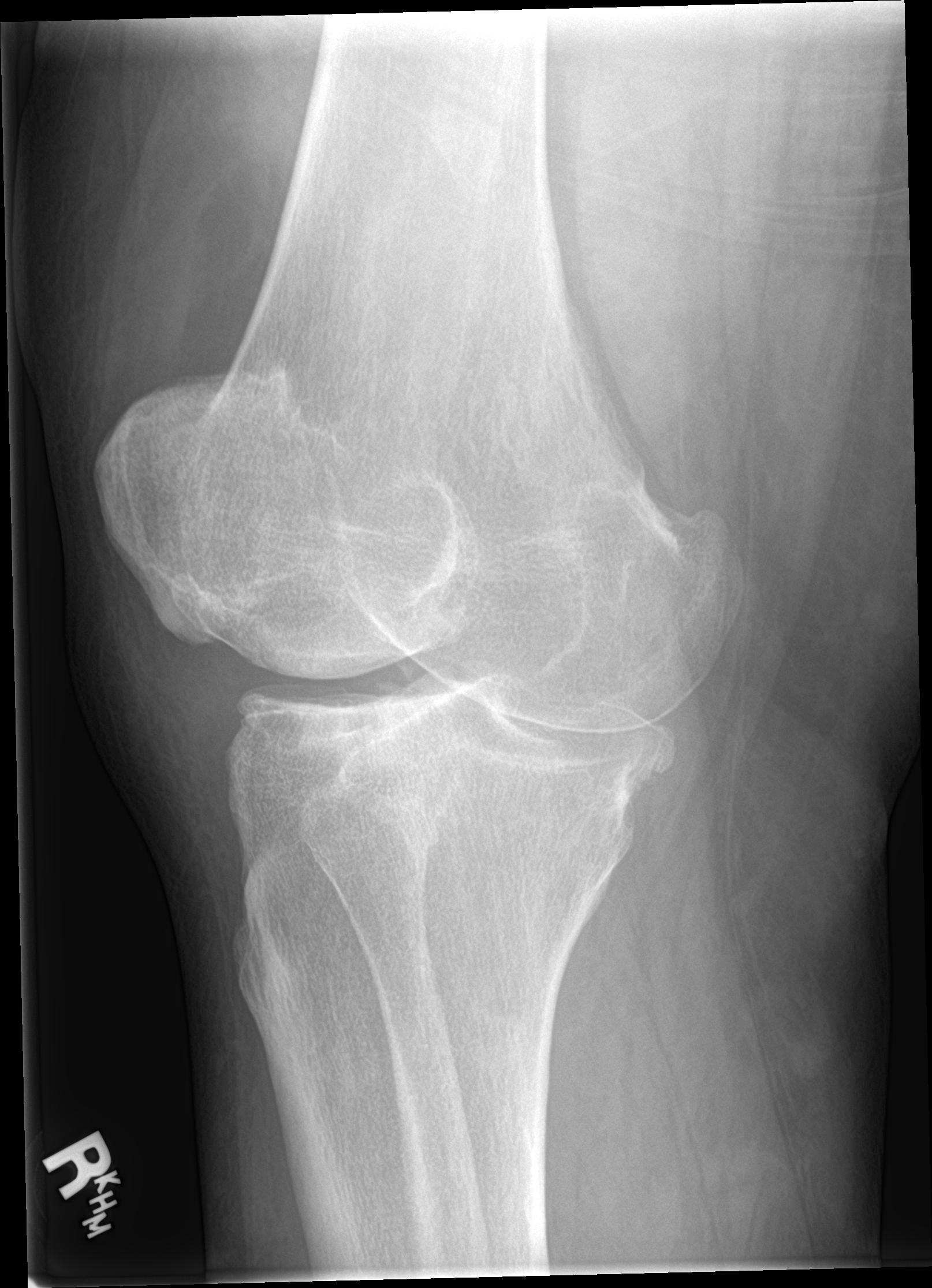

[tunnel]
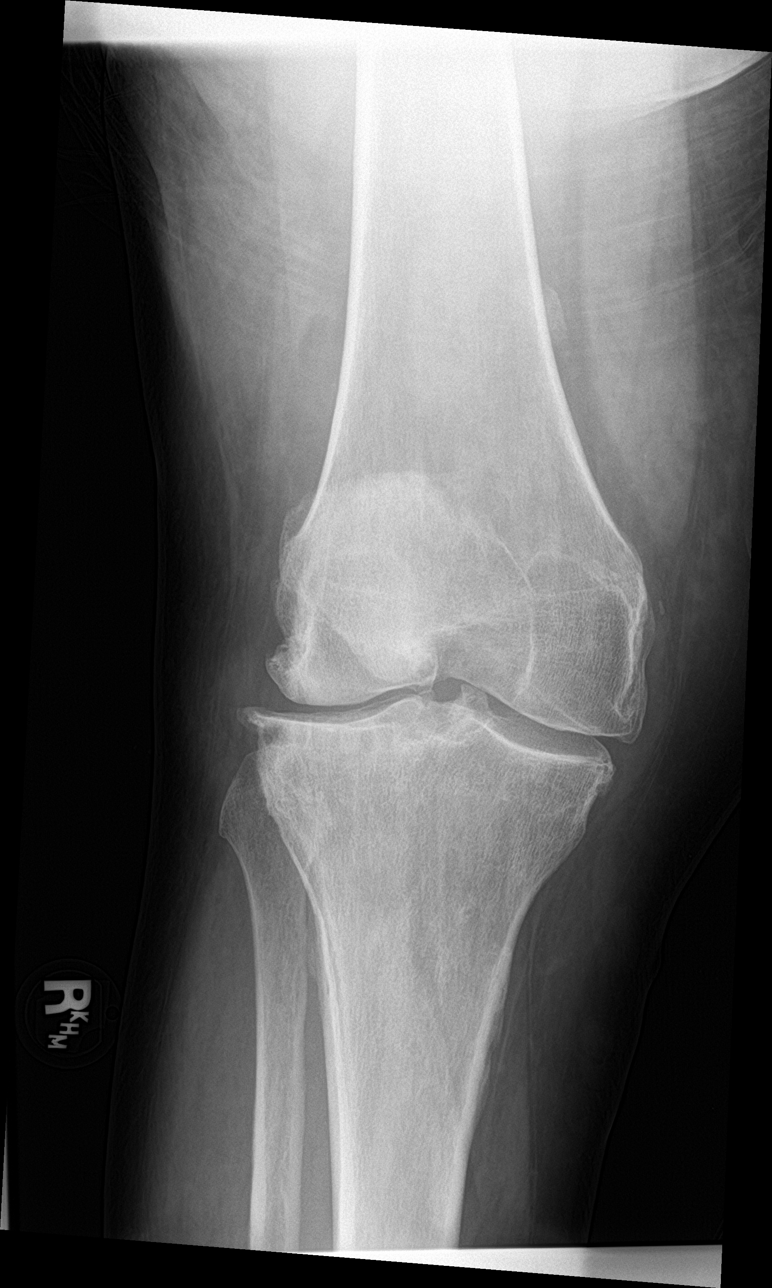

[knee lat]
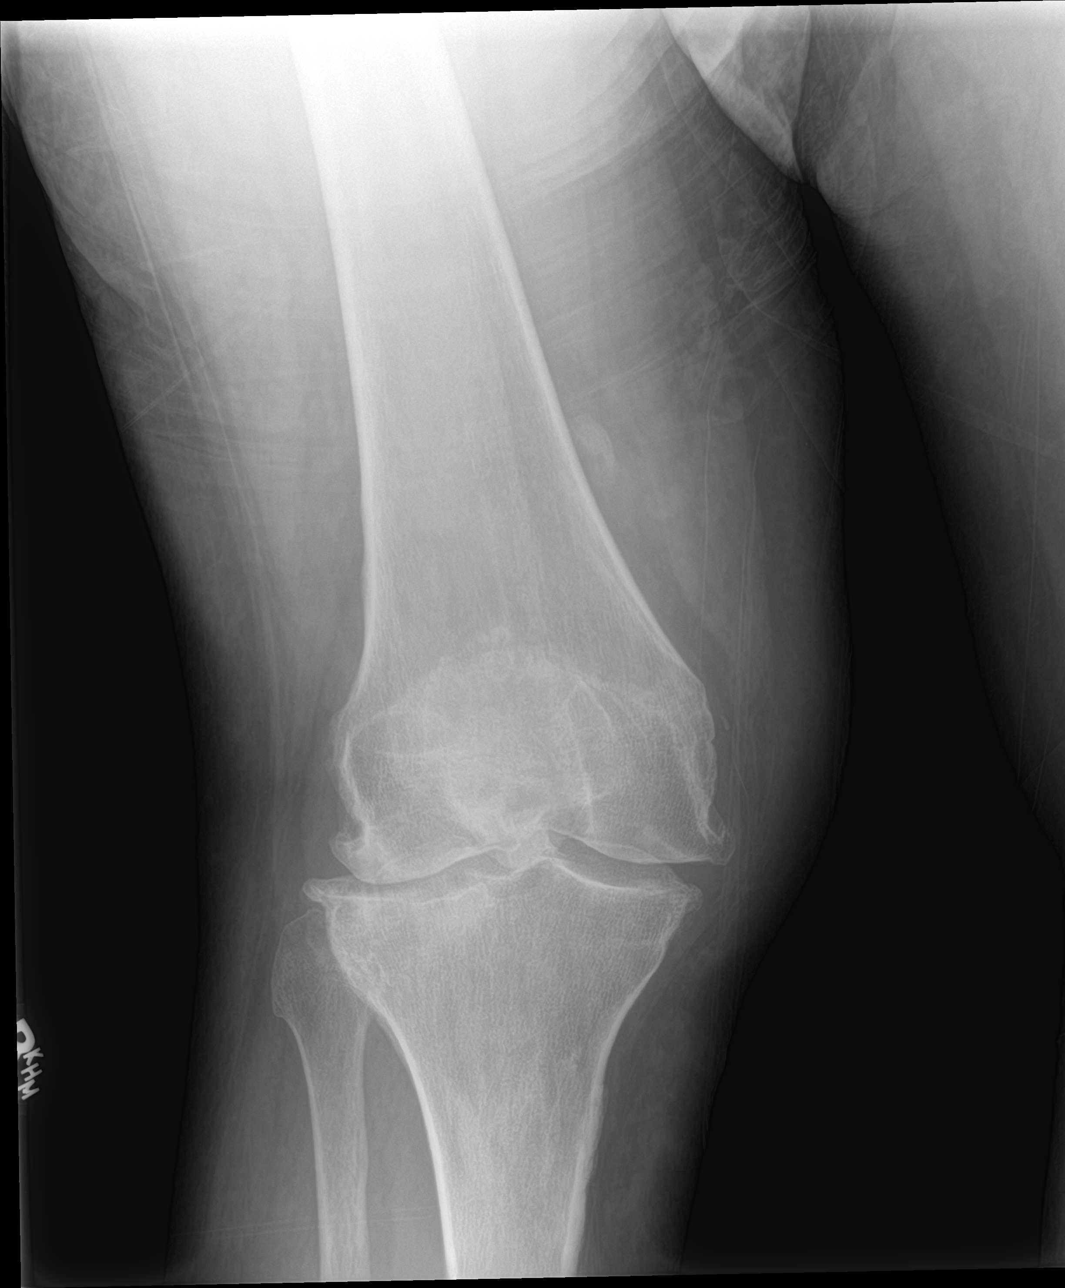

[knee sunrise]
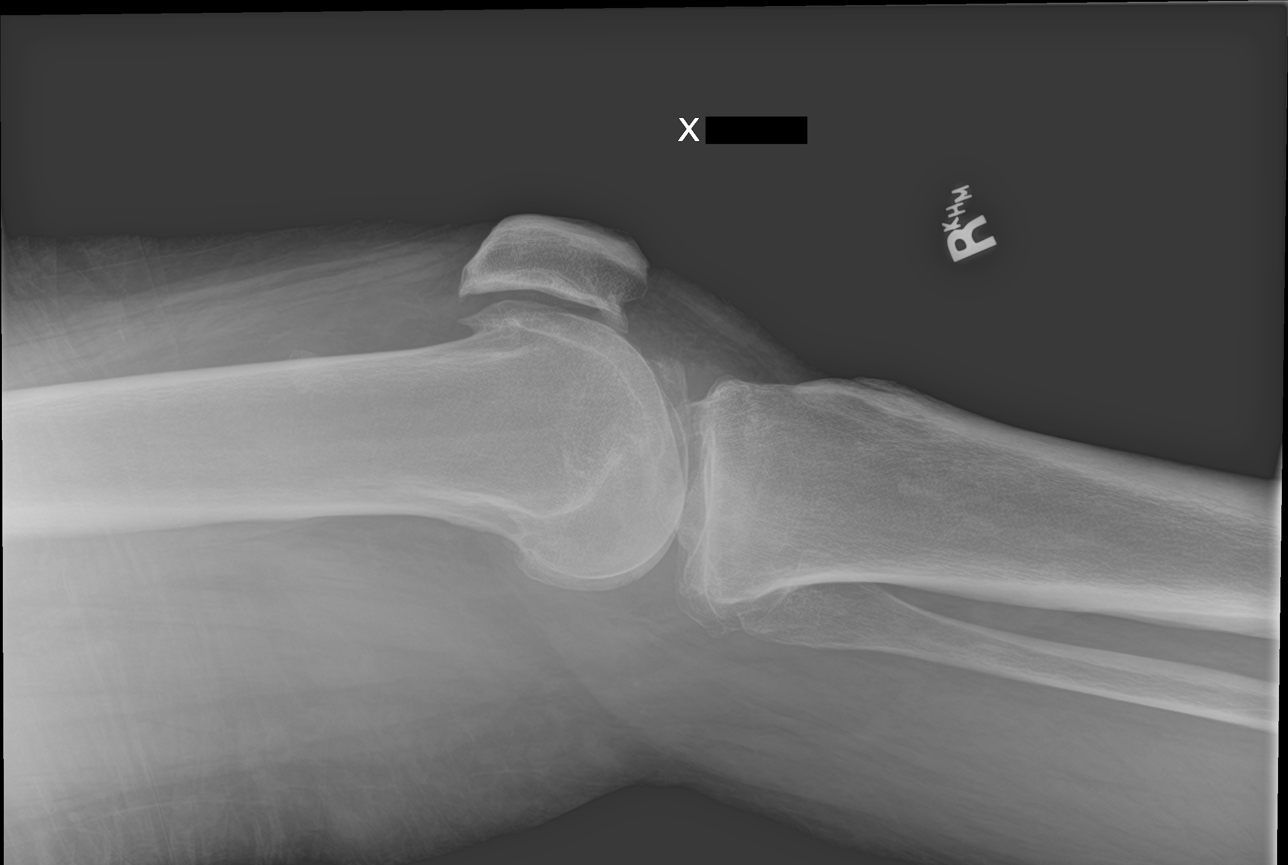

[4 of 4 positions shown; findings below may reference images not displayed]

FINDINGS: Progressive spur formation involving all 3 joint compartments with
progressive joint space narrowing involving all 3 joint
compartments. No effusion or fracture seen.
IMPRESSION: Progressive tricompartmental degenerative changes. No acute
abnormality.

## 2019-08-05 ENCOUNTER — Ambulatory Visit: Payer: Medicaid - Out of State | Admitting: Orthopaedic Surgery

## 2019-08-12 ENCOUNTER — Other Ambulatory Visit: Payer: Self-pay

## 2019-08-12 ENCOUNTER — Ambulatory Visit: Payer: Medicaid - Out of State | Admitting: Orthopaedic Surgery

## 2019-08-19 ENCOUNTER — Ambulatory Visit: Payer: Medicaid Other | Admitting: Orthopaedic Surgery

## 2019-08-19 ENCOUNTER — Other Ambulatory Visit: Payer: Self-pay

## 2019-09-02 ENCOUNTER — Encounter: Payer: Self-pay | Admitting: Orthopaedic Surgery

## 2019-09-02 ENCOUNTER — Other Ambulatory Visit: Payer: Self-pay

## 2019-09-02 ENCOUNTER — Ambulatory Visit (INDEPENDENT_AMBULATORY_CARE_PROVIDER_SITE_OTHER): Payer: Medicaid Other

## 2019-09-02 ENCOUNTER — Ambulatory Visit (INDEPENDENT_AMBULATORY_CARE_PROVIDER_SITE_OTHER): Payer: Medicaid Other | Admitting: Orthopaedic Surgery

## 2019-09-02 ENCOUNTER — Ambulatory Visit: Payer: Self-pay

## 2019-09-02 VITALS — BP 174/101 | Ht 74.0 in | Wt 348.0 lb

## 2019-09-02 DIAGNOSIS — G8929 Other chronic pain: Secondary | ICD-10-CM

## 2019-09-02 DIAGNOSIS — M25561 Pain in right knee: Secondary | ICD-10-CM

## 2019-09-02 DIAGNOSIS — M1712 Unilateral primary osteoarthritis, left knee: Secondary | ICD-10-CM

## 2019-09-02 DIAGNOSIS — M25562 Pain in left knee: Secondary | ICD-10-CM | POA: Diagnosis not present

## 2019-09-02 DIAGNOSIS — M1711 Unilateral primary osteoarthritis, right knee: Secondary | ICD-10-CM | POA: Diagnosis not present

## 2019-09-02 DIAGNOSIS — M170 Bilateral primary osteoarthritis of knee: Secondary | ICD-10-CM | POA: Diagnosis not present

## 2019-09-02 NOTE — Progress Notes (Signed)
Office Visit Note   Patient: Christopher Chambers           Date of Birth: 02/15/1975           MRN: [id number] Visit Date: 09/02/2019              Requested by: Randell Patient [facility] [address] [place],  [place] [address] PCP: Health, [facility]   Assessment & Plan: Visit Diagnoses:  1. Chronic pain of both knees   2. Bilateral primary osteoarthritis of knee     Plan: Patient might proceed with right total knee arthroplasty.  He has lost weight he is diabetes is improved his atrial fib is now back in sinus rhythm.  We discussed spinal anesthesia home physical therapy followed by outpatient therapy.  Risk surgery including bleeding infection DVT.  Questions were elicited and answered he understands request to proceed.  Follow-Up Instructions: No follow-ups on file.   Orders:  Orders Placed This Encounter  Procedures  . XR KNEE 3 VIEW LEFT  . XR Knee 1-2 Views Right   No orders of the defined types were placed in this encounter.     Procedures: No procedures performed   Clinical Data: No additional findings.   Subjective: Chief Complaint  Patient presents with  . Left Knee - Pain  . Right Knee - Pain    HPI 44 year old male with bilateral knee osteoarthritis or problems with his right knee than left knee.  He states his pain level is an 8 or 9.  He has been on oxycodone from [facility] which he states helps some.  He was 422 pounds has now lost weight to 348.  He states he is having problems exercising and walking.  He has diabetes is on Lantus insulin also Humalog Victoza glipizide.  He uses some lisinopril for his blood pressure.  Patient is 1 pack/day smoker.  He is referred to me by Dr. Olena Leatherwood for right total knee arthroplasty.  Patient had injections in the past which are now no longer effective.  He is used Voltaren gel.  Review of Systems positive for diabetes morbid obesity with 74 pound weight loss.  Positive for hypertension.   History of bipolar disorder.  History of atrial fibs which was converted with a cardioversion back to normal sinus rhythm.  Since that time has not been on any blood thinners.   Objective: Vital Signs: BP (!) 174/101   Ht 6\' 2"  (1.88 m)   Wt (!) 348 lb (157.9 kg)   BMI 44.68 kg/m   Physical Exam Constitutional:      Appearance: He is well-developed.  HENT:     Head: Normocephalic and atraumatic.  Eyes:     Pupils: Pupils are equal, round, and reactive to light.  Neck:     Thyroid: No thyromegaly.     Trachea: No tracheal deviation.  Cardiovascular:     Rate and Rhythm: Normal rate.  Pulmonary:     Effort: Pulmonary effort is normal.     Breath sounds: No wheezing.  Abdominal:     General: Bowel sounds are normal.     Palpations: Abdomen is soft.  Skin:    General: Skin is warm and dry.     Capillary Refill: Capillary refill takes less than 2 seconds.  Neurological:     Mental Status: He is alert and oriented to person, place, and time.  Psychiatric:        Behavior: Behavior normal.  Thought Content: Thought content normal.        Judgment: Judgment normal.     Ortho Exam patient has negative logroll to the hips.  Decreased station feet and ankles.  Reflexes are 2+ and symmetrical.  Crepitus with knee range of motion right and left.  Right knee lacks 15 degrees reaching full extension he can flex to 90 degrees.  Left knee has full extension and flexion to 100 degrees.  Pedal pulses are palpable no venous stasis changes.  Specialty Comments:  No specialty comments available.  Imaging: Standing AP both knees lateral right knee and patella sunrise view demonstrates tricompartmental arthritis bone-on-bone changes in the lateral compartment.  Complete loss of joint space laterally.  Assessment: severe tricompartmental right knee osteoarthritis  AP x-rays standing both knees lateral left knee and sunrise patellar x-ray demonstrate tricompartmental degenerative  arthritis with 5 mm subluxation medially femur on tibia marginal osteophytes subchondral sclerosis.  Impression: Severe tricompartmental left knee osteoarthritis.  PMFS History: Patient Active Problem List   Diagnosis Date Noted  . Bilateral primary osteoarthritis of knee 09/05/2019  . Atrial fibrillation with rapid ventricular response (HCC) 10/07/2013  . Morbid obesity (HCC) 10/07/2013  . Sleep apnea 10/07/2013  . A-fib (HCC) 10/07/2013  . Polysubstance abuse (HCC) 09/16/2012  . Bipolar disorder (HCC)   . Depression   . Hypertension    Past Medical History:  Diagnosis Date  . Atrial fibrillation (HCC)   . Bipolar disorder (HCC)   . Depression   . Diabetes mellitus without complication (HCC)   . Hypertension     Family History  Problem Relation Age of Onset  . Heart failure Maternal Grandmother     Past Surgical History:  Procedure Laterality Date  . CARDIOVERSION     Social History   Occupational History  . Not on file  Tobacco Use  . Smoking status: Current Every Day Smoker    Packs/day: 0.25    Types: Cigarettes  . Smokeless tobacco: Never Used  Vaping Use  . Vaping Use: Never used  Substance and Sexual Activity  . Alcohol use: No  . Drug use: No  . Sexual activity: Not on file

## 2019-09-05 DIAGNOSIS — M170 Bilateral primary osteoarthritis of knee: Secondary | ICD-10-CM | POA: Insufficient documentation

## 2019-11-28 ENCOUNTER — Emergency Department (HOSPITAL_COMMUNITY)
Admission: EM | Admit: 2019-11-28 | Discharge: 2019-11-29 | Disposition: A | Discharge status: Home / Self Care | Payer: Medicaid Other | Attending: Emergency Medicine | Admitting: Emergency Medicine

## 2019-11-28 ENCOUNTER — Other Ambulatory Visit: Payer: Self-pay

## 2019-11-28 ENCOUNTER — Encounter (HOSPITAL_COMMUNITY): Payer: Self-pay | Admitting: Emergency Medicine

## 2019-11-28 DIAGNOSIS — E1165 Type 2 diabetes mellitus with hyperglycemia: Secondary | ICD-10-CM | POA: Insufficient documentation

## 2019-11-28 DIAGNOSIS — R1013 Epigastric pain: Secondary | ICD-10-CM | POA: Diagnosis not present

## 2019-11-28 DIAGNOSIS — R739 Hyperglycemia, unspecified: Secondary | ICD-10-CM

## 2019-11-28 DIAGNOSIS — F17210 Nicotine dependence, cigarettes, uncomplicated: Secondary | ICD-10-CM | POA: Diagnosis not present

## 2019-11-28 DIAGNOSIS — I10 Essential (primary) hypertension: Secondary | ICD-10-CM | POA: Insufficient documentation

## 2019-11-28 DIAGNOSIS — Z79899 Other long term (current) drug therapy: Secondary | ICD-10-CM | POA: Insufficient documentation

## 2019-11-28 LAB — CBC
HCT: 45.9 % (ref 39.0–52.0)
Hemoglobin: 16.1 g/dL (ref 13.0–17.0)
MCH: 29.5 pg (ref 26.0–34.0)
MCHC: 35.1 g/dL (ref 30.0–36.0)
MCV: 84.1 fL (ref 80.0–100.0)
Platelets: 357 10*3/uL (ref 150–400)
RBC: 5.46 MIL/uL (ref 4.22–5.81)
RDW: 12.7 % (ref 11.5–15.5)
WBC: 14.7 10*3/uL — ABNORMAL HIGH (ref 4.0–10.5)
nRBC: 0 % (ref 0.0–0.2)

## 2019-11-28 LAB — CBG MONITORING, ED: Glucose-Capillary: >600 mg/dL (ref 70–99)

## 2019-11-28 NOTE — ED Triage Notes (Signed)
Pt states his blood sugar has been staying elevated over last couple of days (585) and that today when he has checked it; his meter has been reading "high". Last dose of insulin was at 4pm and was Humalog 15units.

## 2019-11-29 LAB — BASIC METABOLIC PANEL
Anion gap: 13 (ref 5–15)
BUN: 37 mg/dL — ABNORMAL HIGH (ref 6–20)
CO2: 28 mmol/L (ref 22–32)
Calcium: 9.4 mg/dL (ref 8.9–10.3)
Chloride: 85 mmol/L — ABNORMAL LOW (ref 98–111)
Creatinine, Ser: 3.12 mg/dL — ABNORMAL HIGH (ref 0.61–1.24)
GFR, Estimated: 24 mL/min — ABNORMAL LOW (ref >60)
Glucose, Bld: 618 mg/dL (ref 70–99)
Potassium: 4 mmol/L (ref 3.5–5.1)
Sodium: 126 mmol/L — ABNORMAL LOW (ref 135–145)

## 2019-11-29 LAB — CBG MONITORING, ED
Glucose-Capillary: 587 mg/dL (ref 70–99)
Glucose-Capillary: 435 mg/dL — ABNORMAL HIGH (ref 70–99)
Glucose-Capillary: 372 mg/dL — ABNORMAL HIGH (ref 70–99)
Glucose-Capillary: 307 mg/dL — ABNORMAL HIGH (ref 70–99)
Glucose-Capillary: 322 mg/dL — ABNORMAL HIGH (ref 70–99)
Glucose-Capillary: 246 mg/dL — ABNORMAL HIGH (ref 70–99)

## 2019-11-29 LAB — URINALYSIS, ROUTINE W REFLEX MICROSCOPIC
Bacteria, UA: NONE SEEN
Bilirubin Urine: NEGATIVE
Glucose, UA: >=500 mg/dL — AB
Hgb urine dipstick: NEGATIVE
Ketones, ur: NEGATIVE mg/dL
Leukocytes,Ua: NEGATIVE
Nitrite: NEGATIVE
Protein, ur: 30 mg/dL — AB
Specific Gravity, Urine: 1.022 (ref 1.005–1.030)
pH: 5 (ref 5.0–8.0)

## 2019-11-29 MED ORDER — LACTATED RINGERS IV SOLN: INTRAVENOUS | Status: DC

## 2019-11-29 MED ORDER — DEXTROSE IN LACTATED RINGERS 5 % IV SOLN: INTRAVENOUS | Status: DC

## 2019-11-29 MED ORDER — DEXTROSE 50 % IV SOLN: 0.0000 mL | INTRAVENOUS | Status: DC | PRN

## 2019-11-29 MED ORDER — SODIUM CHLORIDE 0.9 % IV BOLUS
1000.0000 mL | Freq: Once | INTRAVENOUS | Status: AC
  Administered 2019-11-29: 1000 mL via INTRAVENOUS

## 2019-11-29 MED ORDER — INSULIN REGULAR(HUMAN) IN NACL 100-0.9 UT/100ML-% IV SOLN
INTRAVENOUS | Status: DC
  Administered 2019-11-29: 11.5 [IU]/h via INTRAVENOUS
  Filled 2019-11-29: qty 100

## 2019-11-29 NOTE — ED Provider Notes (Signed)
[facility] EMERGENCY DEPARTMENT Provider Note   CSN: [id number] Arrival date & time: 11/28/19  2303   Time seen 1:24 AM  History Chief Complaint  Patient presents with  . Hyperglycemia    Christopher Chambers is a 44 y.o. male.  HPI   Patient states she has been in a diabetic for about 10 to 12 years.  He states he is usually well controlled however he has episodes where it flares up.  He states he has been on Metformin in the past but it "made me sick".  He was on [place] and Lantus at bedtime.  He was seen by his kidney specialist and she had him stop the [place] and put him back on Metformin which he cannot take.  He states his primary care doctor about 6 weeks ago started him on Victoza in the morning, Humalog with meals, and Lantus at bedtime.  He states his A1c's were as high as 13 however about 6 weeks ago it was down to 7.  He states a few days ago, October 22 he started feeling bad.  He states he felt tired without energy was having blurred vision.  He also complains of epigastric abdominal pain and when he eats he gets sharp and lasts for few seconds.  He states he states sometimes he feels like his food is getting stuck when he swallows.  He denies nausea, vomiting, or diarrhea.  He does describe polyuria and nocturia about 6-7 times a night and polydipsia.  PCP Toma Deiters, MD   Past Medical History:  Diagnosis Date  . Atrial fibrillation (HCC)   . Bipolar disorder (HCC)   . Depression   . Diabetes mellitus without complication (HCC)   . Hypertension     Patient Active Problem List   Diagnosis Date Noted  . Bilateral primary osteoarthritis of knee 09/05/2019  . Atrial fibrillation with rapid ventricular response (HCC) 10/07/2013  . Morbid obesity (HCC) 10/07/2013  . Sleep apnea 10/07/2013  . A-fib (HCC) 10/07/2013  . Polysubstance abuse (HCC) 09/16/2012  . Bipolar disorder (HCC)   . Depression   . Hypertension     Past Surgical  History:  Procedure Laterality Date  . CARDIOVERSION         Family History  Problem Relation Age of Onset  . Heart failure Maternal Grandmother     Social History   Tobacco Use  . Smoking status: Current Every Day Smoker    Packs/day: 0.25    Types: Cigarettes  . Smokeless tobacco: Never Used  Vaping Use  . Vaping Use: Never used  Substance Use Topics  . Alcohol use: No  . Drug use: No    Home Medications Prior to Admission medications   Medication Sig Start Date End Date Taking? Authorizing Provider  acetaminophen (TYLENOL) 500 MG tablet Take 1,000 mg by mouth every 6 (six) hours as needed for mild pain.    [provider]  amLODIPine Besylate (NORVASC PO) Take 1 tablet by mouth daily.    [provider]  diltiazem (CARDIZEM CD) 180 MG 24 hr capsule Take 1 capsule (180 mg total) by mouth daily. Patient not taking: Reported on 04/27/2017 10/08/13   Rhetta Mura, MD  glipiZIDE (GLUCOTROL) 10 MG tablet Take 20 mg by mouth 2 (two) times daily.  07/23/16   [provider]  JANUVIA 100 MG tablet Take 1 tablet by mouth daily. 07/23/16   [provider]  LANTUS 100 UNIT/ML injection Inject 25  Units into the skin 2 (two) times daily. 03/04/18   [provider]  lisinopril-hydrochlorothiazide (PRINZIDE,ZESTORETIC) 20-25 MG tablet Take 1 tablet by mouth daily. 07/23/16   [provider]  oxyCODONE-acetaminophen (PERCOCET) 10-325 MG tablet Take 1 tablet by mouth every 6 (six) hours as needed. 02/09/19   [provider]  pantoprazole (PROTONIX) 40 MG tablet Take 40 mg by mouth daily as needed. For GERD/ acid reflux 01/07/18   [provider]  sulfamethoxazole-trimethoprim (BACTRIM DS,SEPTRA DS) 800-160 MG tablet Take 1 tablet by mouth 2 (two) times daily. 03/27/18   Mancel Bale, MD  XARELTO 20 MG TABS tablet Take 20 mg by mouth daily. 02/27/18   [provider]    Allergies    Patient has no known  allergies.  Review of Systems   Review of Systems  All other systems reviewed and are negative.   Physical Exam Updated Vital Signs BP 124/85 (BP Location: Right Arm)   Pulse 76   Temp 98.2 F (36.8 C) (Oral)   Resp 13   Ht 6\' 2"  (1.88 m)   Wt (!) 142.2 kg   SpO2 96%   BMI 40.26 kg/m   Physical Exam Vitals and nursing note reviewed.  Constitutional:      General: He is not in acute distress.    Appearance: He is obese.  HENT:     Head: Normocephalic and atraumatic.     Right Ear: External ear normal.     Left Ear: External ear normal.     Nose: Nose normal.     Mouth/Throat:     Mouth: Mucous membranes are dry.  Eyes:     Extraocular Movements: Extraocular movements intact.     Conjunctiva/sclera: Conjunctivae normal.     Pupils: Pupils are equal, round, and reactive to light.  Cardiovascular:     Rate and Rhythm: Normal rate and regular rhythm.     Pulses: Normal pulses.     Heart sounds: Normal heart sounds.  Pulmonary:     Effort: Pulmonary effort is normal.     Breath sounds: Normal breath sounds.  Abdominal:     Palpations: Abdomen is soft.     Tenderness: There is abdominal tenderness in the epigastric area. There is no guarding or rebound.  Musculoskeletal:        General: Normal range of motion.     Cervical back: Normal range of motion.  Skin:    General: Skin is warm and dry.  Neurological:     General: No focal deficit present.     Mental Status: He is alert and oriented to person, place, and time.     Cranial Nerves: No cranial nerve deficit.  Psychiatric:        Mood and Affect: Mood normal.        Behavior: Behavior normal.        Thought Content: Thought content normal.     ED Results / Procedures / Treatments   Labs (all labs ordered are listed, but only abnormal results are displayed) Results for orders placed or performed during the hospital encounter of 11/28/19  Basic metabolic panel  Result Value Ref Range   Sodium 126 (L) 135 -  145 mmol/L   Potassium 4.0 3.5 - 5.1 mmol/L   Chloride 85 (L) 98 - 111 mmol/L   CO2 28 22 - 32 mmol/L   Glucose, Bld 618 (HH) 70 - 99 mg/dL   BUN 37 (H) 6 - 20 mg/dL   Creatinine,  Ser 3.12 (H) 0.61 - 1.24 mg/dL   Calcium 9.4 8.9 - 00.8 mg/dL   GFR, Estimated 24 (L) >60 mL/min   Anion gap 13 5 - 15  CBC  Result Value Ref Range   WBC 14.7 (H) 4.0 - 10.5 K/uL   RBC 5.46 4.22 - 5.81 MIL/uL   Hemoglobin 16.1 13.0 - 17.0 g/dL   HCT 67.6 39 - 52 %   MCV 84.1 80.0 - 100.0 fL   MCH 29.5 26.0 - 34.0 pg   MCHC 35.1 30.0 - 36.0 g/dL   RDW 19.5 09.3 - 26.7 %   Platelets 357 150 - 400 K/uL   nRBC 0.0 0.0 - 0.2 %  Urinalysis, Routine w reflex microscopic  Result Value Ref Range   Color, Urine YELLOW YELLOW   APPearance CLEAR CLEAR   Specific Gravity, Urine 1.022 1.005 - 1.030   pH 5.0 5.0 - 8.0   Glucose, UA >=500 (A) NEGATIVE mg/dL   Hgb urine dipstick NEGATIVE NEGATIVE   Bilirubin Urine NEGATIVE NEGATIVE   Ketones, ur NEGATIVE NEGATIVE mg/dL   Protein, ur 30 (A) NEGATIVE mg/dL   Nitrite NEGATIVE NEGATIVE   Leukocytes,Ua NEGATIVE NEGATIVE   RBC / HPF 0-5 0 - 5 RBC/hpf   WBC, UA 0-5 0 - 5 WBC/hpf   Bacteria, UA NONE SEEN NONE SEEN   Squamous Epithelial / LPF 0-5 0 - 5  CBG monitoring, ED  Result Value Ref Range   Glucose-Capillary >600 (HH) 70 - 99 mg/dL   Comment 1 Document in Chart   CBG monitoring, ED  Result Value Ref Range   Glucose-Capillary 587 (HH) 70 - 99 mg/dL   Comment 1 Document in Chart   CBG monitoring, ED  Result Value Ref Range   Glucose-Capillary 435 (H) 70 - 99 mg/dL  CBG monitoring, ED  Result Value Ref Range   Glucose-Capillary 372 (H) 70 - 99 mg/dL  CBG monitoring, ED  Result Value Ref Range   Glucose-Capillary 322 (H) 70 - 99 mg/dL  CBG monitoring, ED  Result Value Ref Range   Glucose-Capillary 307 (H) 70 - 99 mg/dL  CBG monitoring, ED  Result Value Ref Range   Glucose-Capillary 246 (H) 70 - 99 mg/dL   Laboratory interpretation all normal except  hyperglycemia without metabolic acidosis    EKG None  Radiology No results found.  Procedures .Critical Care Performed by: Devoria Albe, MD Authorized by: Devoria Albe, MD   Critical care provider statement:    Critical care time (minutes):  32   Critical care was necessary to treat or prevent imminent or life-threatening deterioration of the following conditions:  Endocrine crisis   Critical care was time spent personally by me on the following activities:  Examination of patient, obtaining history from patient or surrogate, ordering and review of laboratory studies and re-evaluation of patient's condition   (including critical care time)  Medications Ordered in ED Medications  insulin regular, human (MYXREDLIN) 100 units/ 100 mL infusion (7.5 Units/hr Intravenous Rate/Dose Change 11/29/19 0427)  lactated ringers infusion (has no administration in time range)  dextrose 5 % in lactated ringers infusion ( Intravenous New Bag/Given 11/29/19 0541)  dextrose 50 % solution 0-50 mL (has no administration in time range)  sodium chloride 0.9 % bolus 1,000 mL (0 mLs Intravenous Stopped 11/29/19 0232)  sodium chloride 0.9 % bolus 1,000 mL (0 mLs Intravenous Stopped 11/29/19 0232)    ED Course  I have reviewed the triage vital signs and the nursing notes.  Pertinent labs & imaging results that were available during my care of the patient were reviewed by me and considered in my medical decision making (see chart for details).    MDM Rules/Calculators/A&P                          Patient was given IV fluid bolus and started on insulin drip.  Patient was continued on the insulin drip and his blood sugar slowly improved until about 5:30 AM when his blood sugar was 246.  At this point patient was discharged home.  Hopefully we have "reset" his glucose so he can keep it managed at home.  Final Clinical Impression(s) / ED Diagnoses Final diagnoses:  Hyperglycemia    Rx / DC Orders ED  Discharge Orders    None      Plan discharge  Devoria AlbeIva Blaire Hodsdon, MD, Concha PyoFACEP    Ryeleigh Santore, MD 11/29/19 [phone]

## 2019-11-29 NOTE — ED Notes (Signed)
Rate maintained per endo tool orders.

## 2019-11-29 NOTE — Discharge Instructions (Addendum)
Monitor your blood sugar closely over the next couple days.  Please follow-up with your primary care doctor and let them know about your ED visit tonight.  They might need to consider putting you on an insulin pump or continuous glucose monitoring system to help you keep better track of your blood sugars.

## 2019-11-29 NOTE — ED Notes (Signed)
Pt educated with DC instructions and verbalized complete understanding, denies questions at this time. Pt IV removed fully intact and dressing applied. Pt self ambulated to exit with steady gait and wife to transport.

## 2019-12-02 ENCOUNTER — Encounter (INDEPENDENT_AMBULATORY_CARE_PROVIDER_SITE_OTHER): Payer: Self-pay | Admitting: *Deleted

## 2020-01-05 ENCOUNTER — Ambulatory Visit (INDEPENDENT_AMBULATORY_CARE_PROVIDER_SITE_OTHER): Payer: Medicaid Other | Admitting: Surgery

## 2020-01-05 ENCOUNTER — Encounter: Payer: Self-pay | Admitting: Surgery

## 2020-01-05 ENCOUNTER — Other Ambulatory Visit: Payer: Self-pay

## 2020-01-05 VITALS — BP 149/105 | HR 80 | Ht 74.0 in | Wt 334.6 lb

## 2020-01-05 DIAGNOSIS — M1711 Unilateral primary osteoarthritis, right knee: Secondary | ICD-10-CM

## 2020-01-06 ENCOUNTER — Other Ambulatory Visit: Payer: Self-pay

## 2020-01-07 ENCOUNTER — Inpatient Hospital Stay (HOSPITAL_COMMUNITY): Admission: RE | Admit: 2020-01-07 | Payer: Medicaid Other | Source: Ambulatory Visit

## 2020-01-10 ENCOUNTER — Encounter (HOSPITAL_COMMUNITY): Payer: Self-pay | Admitting: Vascular Surgery

## 2020-01-10 ENCOUNTER — Other Ambulatory Visit (HOSPITAL_COMMUNITY)
Admission: RE | Admit: 2020-01-10 | Discharge: 2020-01-10 | Disposition: A | Discharge status: Home / Self Care | Payer: Medicaid Other | Source: Ambulatory Visit | Attending: Orthopaedic Surgery | Admitting: Orthopaedic Surgery

## 2020-01-10 ENCOUNTER — Other Ambulatory Visit (HOSPITAL_COMMUNITY): Payer: Medicaid Other

## 2020-01-10 ENCOUNTER — Other Ambulatory Visit: Payer: Self-pay

## 2020-01-10 ENCOUNTER — Encounter (HOSPITAL_COMMUNITY)
Admission: RE | Admit: 2020-01-10 | Discharge: 2020-01-10 | Disposition: A | Discharge status: Home / Self Care | Payer: Medicaid Other | Source: Ambulatory Visit | Attending: Orthopaedic Surgery | Admitting: Orthopaedic Surgery

## 2020-01-10 ENCOUNTER — Encounter (HOSPITAL_COMMUNITY): Payer: Self-pay

## 2020-01-10 DIAGNOSIS — Z20822 Contact with and (suspected) exposure to covid-19: Secondary | ICD-10-CM | POA: Insufficient documentation

## 2020-01-10 DIAGNOSIS — Z01818 Encounter for other preprocedural examination: Secondary | ICD-10-CM | POA: Insufficient documentation

## 2020-01-10 DIAGNOSIS — Z01812 Encounter for preprocedural laboratory examination: Secondary | ICD-10-CM | POA: Diagnosis present

## 2020-01-10 HISTORY — DX: Sleep apnea, unspecified: G47.30

## 2020-01-10 LAB — URINALYSIS, ROUTINE W REFLEX MICROSCOPIC
Bacteria, UA: NONE SEEN
Bilirubin Urine: NEGATIVE
Glucose, UA: >=500 mg/dL — AB
Hgb urine dipstick: NEGATIVE
Ketones, ur: NEGATIVE mg/dL
Leukocytes,Ua: NEGATIVE
Nitrite: NEGATIVE
Protein, ur: 30 mg/dL — AB
Specific Gravity, Urine: 1.016 (ref 1.005–1.030)
pH: 5 (ref 5.0–8.0)

## 2020-01-10 LAB — CBC
HCT: 46.4 % (ref 39.0–52.0)
Hemoglobin: 15 g/dL (ref 13.0–17.0)
MCH: 29.2 pg (ref 26.0–34.0)
MCHC: 32.3 g/dL (ref 30.0–36.0)
MCV: 90.4 fL (ref 80.0–100.0)
Platelets: 330 10*3/uL (ref 150–400)
RBC: 5.13 MIL/uL (ref 4.22–5.81)
RDW: 13.4 % (ref 11.5–15.5)
WBC: 15.9 10*3/uL — ABNORMAL HIGH (ref 4.0–10.5)
nRBC: 0 % (ref 0.0–0.2)

## 2020-01-10 LAB — COMPREHENSIVE METABOLIC PANEL
ALT: 9 U/L (ref 0–44)
AST: 12 U/L — ABNORMAL LOW (ref 15–41)
Albumin: 3.2 g/dL — ABNORMAL LOW (ref 3.5–5.0)
Alkaline Phosphatase: 97 U/L (ref 38–126)
Anion gap: 11 (ref 5–15)
BUN: 13 mg/dL (ref 6–20)
CO2: 25 mmol/L (ref 22–32)
Calcium: 9.1 mg/dL (ref 8.9–10.3)
Chloride: 103 mmol/L (ref 98–111)
Creatinine, Ser: 1.67 mg/dL — ABNORMAL HIGH (ref 0.61–1.24)
GFR, Estimated: 51 mL/min — ABNORMAL LOW (ref >60)
Glucose, Bld: 54 mg/dL — ABNORMAL LOW (ref 70–99)
Potassium: 4 mmol/L (ref 3.5–5.1)
Sodium: 139 mmol/L (ref 135–145)
Total Bilirubin: 0.6 mg/dL (ref 0.3–1.2)
Total Protein: 7.1 g/dL (ref 6.5–8.1)

## 2020-01-10 LAB — SURGICAL PCR SCREEN
MRSA, PCR: NEGATIVE
Staphylococcus aureus: POSITIVE — AB

## 2020-01-10 LAB — SARS CORONAVIRUS 2 (TAT 6-24 HRS): SARS Coronavirus 2: NEGATIVE

## 2020-01-10 LAB — HEMOGLOBIN A1C
Hgb A1c MFr Bld: 10.2 % — ABNORMAL HIGH (ref 4.8–5.6)
Mean Plasma Glucose: 246.04 mg/dL

## 2020-01-10 LAB — GLUCOSE, CAPILLARY: Glucose-Capillary: 95 mg/dL (ref 70–99)

## 2020-01-10 NOTE — Progress Notes (Signed)
PCP - Dr. Lia Hopping Cardiologist - Dr. Howell Pringle  Chest x-ray - n/a EKG - 01/10/20 Stress Test - 5+ years ago, hx of A-Fib. Cardioversion x1 ECHO - 05/11/19 Cardiac Cath -denies   Sleep Study - OSA+ CPAP - uses 1-2 nights a week. Does not know settings.   Fasting Blood Sugar - 100-150 Checks Blood Sugar 2 times a day CBG at PAT 95. Will collect A1C today.  Blood Thinner Instructions:n/a Aspirin Instructions:n/a  ERAS Protcol -Yes, pt instructed to stop clear liquids by 0930 DOS. PRE-SURGERY Ensure or G2- pt provided with 10oz bottle of water.  COVID TEST- 01/10/20. Pt verbalized understanding of quarantine instructions.    Anesthesia review: Yes, cardiac hx and clearance.   Patient denies shortness of breath, fever, cough and chest pain at PAT appointment   All instructions explained to the patient, with a verbal understanding of the material. Patient agrees to go over the instructions while at home for a better understanding. Patient also instructed to self quarantine after being tested for COVID-19. The opportunity to ask questions was provided.    Coronavirus Screening  Have you experienced the following symptoms:  Cough yes/no: No Fever (>100.63F)  yes/no: No Runny nose yes/no: No Sore throat yes/no: No Difficulty breathing/shortness of breath  yes/no: No  Have you or a family member traveled in the last 14 days and where? yes/no: No   If the patient indicates "YES" to the above questions, their PAT will be rescheduled to limit the exposure to others and, the surgeon will be notified. THE PATIENT WILL NEED TO BE ASYMPTOMATIC FOR 14 DAYS.   If the patient is not experiencing any of these symptoms, the PAT nurse will instruct them to NOT bring anyone with them to their appointment since they may have these symptoms or traveled as well.   Please remind your patients and families that hospital visitation restrictions are in effect and the importance of the  restrictions.

## 2020-01-10 NOTE — Progress Notes (Signed)
Your procedure is scheduled on Wednesday December 8th.  Report to [facility] Main Entrance "A" at 1030 A.M., and check in at the Admitting office.  Call this number if you have problems the morning of surgery:  [phone]  Call [phone] if you have any questions prior to your surgery date Monday-Friday 8am-4pm    Remember:  Do not eat after midnight the night before your surgery.  You may drink clear liquids until 9:30am the morning of your surgery.   Clear liquids allowed are: Water, Non-Citrus Juices (without pulp), Carbonated Beverages, Clear Tea, Black Coffee Only, and Gatorade.  Please complete the 10oz bottle of water that was provided to you by 9:30 a.m.    Take these medicines the morning of surgery with A SIP OF WATER   diltiazem (CARDIZEM CD) 360 MG 24 hr capsule  pantoprazole (PROTONIX) 40 MG tablet   IF NEEDED  acetaminophen (TYLENOL) 500 MG tablet    Carboxymethylcellul-Glycerin (LUBRICATING EYE DROPS OP)  naloxone (NARCAN) 4 MG/0.1ML LIQD nasal spray kit  oxyCODONE-acetaminophen (PERCOCET) 10-325 MG tablet   WHAT DO I DO ABOUT MY DIABETES MEDICATION?  DAY BEFORE SURGERY  Do NOT take your Jardiance Take only morning and lunch dose of Glipizide, do NOT take evening dose of Glipizide Take usual dose of Humalog before meals, NO BEDTIME DOSE. Take usual dose of Victoza.  . THE NIGHT BEFORE SURGERY,  take ____20_______ units of ____Lantus___insulin.      Marland Kitchen MORNING OF SURGERY Do not take oral diabetes medicines Jardiance and Glipizide.  . The day of surgery, do not take Victoza (liraglutide).  . The day of surgery, If your CBG is greater than 220 mg/dL, you may take  of your sliding scale (correction) dose of Humalog insulin.   HOW TO MANAGE YOUR DIABETES BEFORE AND AFTER SURGERY  Why is it important to control my blood sugar before and after surgery? . Improving blood sugar levels before and after surgery helps healing and can limit problems. . A way of  improving blood sugar control is eating a healthy diet by: o  Eating less sugar and carbohydrates o  Increasing activity/exercise o  Talking with your doctor about reaching your blood sugar goals . High blood sugars (greater than 180 mg/dL) can raise your risk of infections and slow your recovery, so you will need to focus on controlling your diabetes during the weeks before surgery. . Make sure that the doctor who takes care of your diabetes knows about your planned surgery including the date and location.  How do I manage my blood sugar before surgery? . Check your blood sugar at least 4 times a day, starting 2 days before surgery, to make sure that the level is not too high or low. . Check your blood sugar the morning of your surgery when you wake up and every 2 hours until you get to the Short Stay unit. o If your blood sugar is less than 70 mg/dL, you will need to treat for low blood sugar: - Do not take insulin. - Treat a low blood sugar (less than 70 mg/dL) with  cup of clear juice (cranberry or apple), 4 glucose tablets, OR glucose gel. - Recheck blood sugar in 15 minutes after treatment (to make sure it is greater than 70 mg/dL). If your blood sugar is not greater than 70 mg/dL on recheck, call [phone] for further instructions. . Report your blood sugar to the short stay nurse when you get to Short Stay.  Marland Kitchen  If you are admitted to the hospital after surgery: o Your blood sugar will be checked by the staff and you will probably be given insulin after surgery (instead of oral diabetes medicines) to make sure you have good blood sugar levels. o The goal for blood sugar control after surgery is 80-180 mg/dL.    As of today, STOP taking any Aspirin (unless otherwise instructed by your surgeon) Aleve, Naproxen, Ibuprofen, Motrin, Advil, Goody's, BC's, all herbal medications, fish oil, and all vitamins.                      Do not wear jewelry            Do not wear lotions, powders,  colognes, or deodorant.            Do not shave 48 hours prior to surgery.  Men may shave face and neck.            Do not bring valuables to the hospital.            [facility] is not responsible for any belongings or valuables.  Do NOT Smoke (Tobacco/Vaping) or drink Alcohol 24 hours prior to your procedure If you use a CPAP at night, you may bring all equipment for your overnight stay.   Contacts, glasses, dentures or bridgework may not be worn into surgery.      For patients admitted to the hospital, discharge time will be determined by your treatment team.   Patients discharged the day of surgery will not be allowed to drive home, and someone needs to stay with them for 24 hours.    Special instructions:   [facility]- Preparing For Surgery  Before surgery, you can play an important role. Because skin is not sterile, your skin needs to be as free of germs as possible. You can reduce the number of germs on your skin by washing with CHG (chlorahexidine gluconate) Soap before surgery.  CHG is an antiseptic cleaner which kills germs and bonds with the skin to continue killing germs even after washing.    Oral Hygiene is also important to reduce your risk of infection.  Remember - BRUSH YOUR TEETH THE MORNING OF SURGERY WITH YOUR REGULAR TOOTHPASTE  Please do not use if you have an allergy to CHG or antibacterial soaps. If your skin becomes reddened/irritated stop using the CHG.  Do not shave (including legs and underarms) for at least 48 hours prior to first CHG shower. It is OK to shave your face.  Please follow these instructions carefully.   1. Shower the NIGHT BEFORE SURGERY and the MORNING OF SURGERY with CHG Soap.   2. If you chose to wash your hair, wash your hair first as usual with your normal shampoo.  3. After you shampoo, rinse your hair and body thoroughly to remove the shampoo.  4. Use CHG as you would any other liquid soap. You can apply CHG directly to the skin and  wash gently with a scrungie or a clean washcloth.   5. Apply the CHG Soap to your body ONLY FROM THE NECK DOWN.  Do not use on open wounds or open sores. Avoid contact with your eyes, ears, mouth and genitals (private parts). Wash Face and genitals (private parts)  with your normal soap.   6. Wash thoroughly, paying special attention to the area where your surgery will be performed.  7. Thoroughly rinse your body with warm water from the neck  down.  8. DO NOT shower/wash with your normal soap after using and rinsing off the CHG Soap.  9. Pat yourself dry with a CLEAN TOWEL.  10. Wear CLEAN PAJAMAS to bed the night before surgery  11. Place CLEAN SHEETS on your bed the night of your first shower and DO NOT SLEEP WITH PETS.   Day of Surgery: Wear Clean/Comfortable clothing the morning of surgery Do not apply any deodorants/lotions.   Remember to brush your teeth WITH YOUR REGULAR TOOTHPASTE.   Please read over the following fact sheets that you were given.

## 2020-01-10 NOTE — Progress Notes (Signed)
[facility] [address] [place] [address] Phone: [phone] Fax: [phone]      Your procedure is scheduled on Wednesday December 8th.  Report to [facility] Main Entrance "A" at 1030 A.M., and check in at the Admitting office.  Call this number if you have problems the morning of surgery:  [phone]  Call [phone] if you have any questions prior to your surgery date Monday-Friday 8am-4pm    Remember:  Do not eat after midnight the night before your surgery   Enhanced Recovery after Surgery for Orthopedics Enhanced Recovery after Surgery is a protocol used to improve the stress on your body and your recovery after surgery.  Patient Instructions  . The night before surgery:  o No food after midnight. ONLY clear liquids after midnight  . The day of surgery (if you have diabetes): o  o Drink ONE (1) Gatorade 2 (G2) by _9:30 am____ the morning of surgery o This drink was given to you during your hospital  pre-op appointment visit.  o Color of the Gatorade may vary. Red is not allowed. o Nothing else to drink after completing the  Gatorade 2 (G2).         If you have questions, please contact your surgeon's office.   You may drink clear liquids until 9:30am the morning of your surgery.   Clear liquids allowed are: Water, Non-Citrus Juices (without pulp), Carbonated Beverages, Clear Tea, Black Coffee Only, and Gatorade    Take these medicines the morning of surgery with A SIP OF WATER   diltiazem (CARDIZEM CD) 360 MG 24 hr capsule  pantoprazole (PROTONIX) 40 MG tablet   IF NEEDED  acetaminophen (TYLENOL) 500 MG tablet    Carboxymethylcellul-Glycerin (LUBRICATING EYE DROPS OP)  naloxone (NARCAN) 4 MG/0.1ML LIQD nasal spray kit  oxyCODONE-acetaminophen (PERCOCET) 10-325 MG tablet   WHAT DO I DO ABOUT MY DIABETES MEDICATION?  DAY BEFORE SURGERY  Do NOT take your Jardiance Take only morning and lunch dose of  Glipizide, do NOT take evening dose of Glipizide Take usual dose of Humalog before meals, NO BEDTIME DOSE. Take usual dose of Victoza.  . THE NIGHT BEFORE SURGERY,  take ____20_______ units of ____Lantus___insulin.     Marland Kitchen MORNING OF SURGERY Do not take oral diabetes medicines Jardiance and Glipizide.  . The day of surgery, do not take other diabetes injectables, including Byetta (exenatide), Bydureon (exenatide ER), Victoza (liraglutide), or Trulicity (dulaglutide).  . If your CBG is greater than 220 mg/dL, you may take  of your sliding scale (correction) dose of Humalog insulin.   HOW TO MANAGE YOUR DIABETES BEFORE AND AFTER SURGERY  Why is it important to control my blood sugar before and after surgery? . Improving blood sugar levels before and after surgery helps healing and can limit problems. . A way of improving blood sugar control is eating a healthy diet by: o  Eating less sugar and carbohydrates o  Increasing activity/exercise o  Talking with your doctor about reaching your blood sugar goals . High blood sugars (greater than 180 mg/dL) can raise your risk of infections and slow your recovery, so you will need to focus on controlling your diabetes during the weeks before surgery. . Make sure that the doctor who takes care of your diabetes knows about your planned surgery including the date and location.  How do I manage my blood sugar before surgery? . Check your blood sugar at least 4  times a day, starting 2 days before surgery, to make sure that the level is not too high or low. . Check your blood sugar the morning of your surgery when you wake up and every 2 hours until you get to the Short Stay unit. o If your blood sugar is less than 70 mg/dL, you will need to treat for low blood sugar: - Do not take insulin. - Treat a low blood sugar (less than 70 mg/dL) with  cup of clear juice (cranberry or apple), 4 glucose tablets, OR glucose gel. - Recheck blood sugar in 15 minutes  after treatment (to make sure it is greater than 70 mg/dL). If your blood sugar is not greater than 70 mg/dL on recheck, call [phone] for further instructions. . Report your blood sugar to the short stay nurse when you get to Short Stay.  . If you are admitted to the hospital after surgery: o Your blood sugar will be checked by the staff and you will probably be given insulin after surgery (instead of oral diabetes medicines) to make sure you have good blood sugar levels. o The goal for blood sugar control after surgery is 80-180 mg/dL.    As of today, STOP taking any Aspirin (unless otherwise instructed by your surgeon) Aleve, Naproxen, Ibuprofen, Motrin, Advil, Goody's, BC's, all herbal medications, fish oil, and all vitamins.                      Do not wear jewelry            Do not wear lotions, powders, colognes, or deodorant.            Do not shave 48 hours prior to surgery.  Men may shave face and neck.            Do not bring valuables to the hospital.            [facility] is not responsible for any belongings or valuables.  Do NOT Smoke (Tobacco/Vaping) or drink Alcohol 24 hours prior to your procedure If you use a CPAP at night, you may bring all equipment for your overnight stay.   Contacts, glasses, dentures or bridgework may not be worn into surgery.      For patients admitted to the hospital, discharge time will be determined by your treatment team.   Patients discharged the day of surgery will not be allowed to drive home, and someone needs to stay with them for 24 hours.    Special instructions:   [facility]- Preparing For Surgery  Before surgery, you can play an important role. Because skin is not sterile, your skin needs to be as free of germs as possible. You can reduce the number of germs on your skin by washing with CHG (chlorahexidine gluconate) Soap before surgery.  CHG is an antiseptic cleaner which kills germs and bonds with the skin to continue  killing germs even after washing.    Oral Hygiene is also important to reduce your risk of infection.  Remember - BRUSH YOUR TEETH THE MORNING OF SURGERY WITH YOUR REGULAR TOOTHPASTE  Please do not use if you have an allergy to CHG or antibacterial soaps. If your skin becomes reddened/irritated stop using the CHG.  Do not shave (including legs and underarms) for at least 48 hours prior to first CHG shower. It is OK to shave your face.  Please follow these instructions carefully.   1. Shower the [employer] BEFORE SURGERY  and the MORNING OF SURGERY with CHG Soap.   2. If you chose to wash your hair, wash your hair first as usual with your normal shampoo.  3. After you shampoo, rinse your hair and body thoroughly to remove the shampoo.  4. Use CHG as you would any other liquid soap. You can apply CHG directly to the skin and wash gently with a scrungie or a clean washcloth.   5. Apply the CHG Soap to your body ONLY FROM THE NECK DOWN.  Do not use on open wounds or open sores. Avoid contact with your eyes, ears, mouth and genitals (private parts). Wash Face and genitals (private parts)  with your normal soap.   6. Wash thoroughly, paying special attention to the area where your surgery will be performed.  7. Thoroughly rinse your body with warm water from the neck down.  8. DO NOT shower/wash with your normal soap after using and rinsing off the CHG Soap.  9. Pat yourself dry with a CLEAN TOWEL.  10. Wear CLEAN PAJAMAS to bed the night before surgery  11. Place CLEAN SHEETS on your bed the night of your first shower and DO NOT SLEEP WITH PETS.   Day of Surgery: Wear Clean/Comfortable clothing the morning of surgery Do not apply any deodorants/lotions.   Remember to brush your teeth WITH YOUR REGULAR TOOTHPASTE.   Please read over the following fact sheets that you were given.

## 2020-01-12 ENCOUNTER — Ambulatory Visit (HOSPITAL_COMMUNITY): Admission: RE | Admit: 2020-01-12 | Payer: Medicaid Other | Source: Ambulatory Visit | Admitting: Orthopaedic Surgery

## 2020-01-12 ENCOUNTER — Encounter (HOSPITAL_COMMUNITY): Admission: RE | Payer: Self-pay | Source: Ambulatory Visit

## 2020-01-12 SURGERY — ARTHROPLASTY, KNEE, TOTAL
Anesthesia: Spinal | Site: Knee | Laterality: Right

## 2020-01-20 ENCOUNTER — Ambulatory Visit (INDEPENDENT_AMBULATORY_CARE_PROVIDER_SITE_OTHER): Payer: Medicaid Other | Admitting: Gastroenterology

## 2020-01-20 ENCOUNTER — Telehealth (INDEPENDENT_AMBULATORY_CARE_PROVIDER_SITE_OTHER): Payer: Self-pay

## 2020-01-20 NOTE — Telephone Encounter (Signed)
Noted  

## 2020-01-21 ENCOUNTER — Encounter (INDEPENDENT_AMBULATORY_CARE_PROVIDER_SITE_OTHER): Payer: Self-pay | Admitting: Gastroenterology

## 2020-01-26 ENCOUNTER — Inpatient Hospital Stay: Payer: Medicaid Other | Admitting: Orthopaedic Surgery

## 2020-03-07 NOTE — Progress Notes (Signed)
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  Symptoms unchanged in previous visit.  He is want to proceed with right total knee replacement as scheduled.  Today history and physical performed.  Surgical procedure discussed.  Patient has history of sleep apnea and uses CPAP.  I instructed him to bring his mask to the hospital.  All questions answered.

## 2020-06-22 ENCOUNTER — Encounter: Payer: Self-pay | Admitting: *Deleted

## 2020-10-28 NOTE — Progress Notes (Deleted)
Referring Provider: Dr. Zada Girt  Primary Care Physician:  Neale Burly, MD Primary Gastroenterologist:  Dr. Rayne Du chief complaint on file.   HPI:   Christopher Chambers is a 45 y.o. male presenting today at the request of Dr. Zada Girt for *** ?dysphagia and consult colonoscopy.     Past Medical History:  Diagnosis Date   Atrial fibrillation ([place])    Bipolar disorder ([place])    Depression    Diabetes mellitus without complication ([place])    Hypertension    Sleep apnea     Past Surgical History:  Procedure Laterality Date   CARDIOVERSION      Current Outpatient Medications  Medication Sig Dispense Refill   acetaminophen (TYLENOL) 500 MG tablet Take 1,000 mg by mouth every 6 (six) hours as needed for mild pain.     Carboxymethylcellul-Glycerin (LUBRICATING EYE DROPS OP) Place 1 drop into both eyes daily as needed (dry eyes).     diclofenac Sodium (VOLTAREN) 1 % GEL Apply 1 application topically 4 (four) times daily as needed for pain.     diltiazem (CARDIZEM CD) 180 MG 24 hr capsule Take 1 capsule (180 mg total) by mouth daily. (Patient not taking: Reported on 01/04/2020) 30 capsule 0   diltiazem (CARDIZEM CD) 360 MG 24 hr capsule Take 360 mg by mouth daily.     empagliflozin (JARDIANCE) 10 MG TABS tablet Take 10 mg by mouth daily.     glipiZIDE (GLUCOTROL) 10 MG tablet Take 10 mg by mouth 2 (two) times daily.   6   insulin lispro (HUMALOG) 100 UNIT/ML injection Inject 15 Units into the skin 3 (three) times daily before meals.     LANTUS 100 UNIT/ML injection Inject 40 Units into the skin at bedtime.      losartan (COZAAR) 25 MG tablet Take 25 mg by mouth daily.     naloxone (NARCAN) 4 MG/0.1ML LIQD nasal spray kit Place 1 spray into the nose as needed (opioid overdose).     oxyCODONE-acetaminophen (PERCOCET) 10-325 MG tablet Take 1 tablet by mouth every 6 (six) hours as needed for pain.      pantoprazole (PROTONIX) 40 MG tablet Take 40 mg by mouth daily.      tadalafil  (CIALIS) 5 MG tablet Take 5 mg by mouth daily as needed for erectile dysfunction.     VICTOZA 18 MG/3ML SOPN Inject 1.2 mg into the skin daily.     No current facility-administered medications for this visit.    Allergies as of 10/30/2020 - Review Complete 01/10/2020  Allergen Reaction Noted   Metformin and related Nausea And Vomiting 01/04/2020    Family History  Problem Relation Age of Onset   Heart failure Maternal Grandmother     Social History   Socioeconomic History   Marital status: Legally Separated    Spouse name: Not on file   Number of children: Not on file   Years of education: Not on file   Highest education level: Not on file  Occupational History   Not on file  Tobacco Use   Smoking status: Every Day    Packs/day: 0.25    Types: Cigarettes   Smokeless tobacco: Never  Vaping Use   Vaping Use: Never used  Substance and Sexual Activity   Alcohol use: Yes    Comment: on special occassions   Drug use: Yes    Types: Marijuana    Comment: 1-2 a week.    Sexual activity: Not on file  Other  Topics Concern   Not on file  Social History Narrative   Not on file   Social Determinants of Health   Financial Resource Strain: Not on file  Food Insecurity: Not on file  Transportation Needs: Not on file  Physical Activity: Not on file  Stress: Not on file  Social Connections: Not on file  Intimate Partner Violence: Not on file    Review of Systems: Gen: Denies any fever, chills, fatigue, weight loss, lack of appetite.  CV: Denies chest pain, heart palpitations, peripheral edema, syncope.  Resp: Denies shortness of breath at rest or with exertion. Denies wheezing or cough.  GI: Denies dysphagia or odynophagia. Denies jaundice, hematemesis, fecal incontinence. GU : Denies urinary burning, urinary frequency, urinary hesitancy MS: Denies joint pain, muscle weakness, cramps, or limitation of movement.  Derm: Denies rash, itching, dry skin Psych: Denies  depression, anxiety, memory loss, and confusion Heme: Denies bruising, bleeding, and enlarged lymph nodes.  Physical Exam: There were no vitals taken for this visit. General:   Alert and oriented. Pleasant and cooperative. Well-nourished and well-developed.  Head:  Normocephalic and atraumatic. Eyes:  Without icterus, sclera clear and conjunctiva pink.  Ears:  Normal auditory acuity. Nose:  No deformity, discharge,  or lesions. Mouth:  No deformity or lesions, oral mucosa pink.  Neck:  Supple, without mass or thyromegaly. Lungs:  Clear to auscultation bilaterally. No wheezes, rales, or rhonchi. No distress.  Heart:  S1, S2 present without murmurs appreciated.  Abdomen:  +BS, soft, non-tender and non-distended. No HSM noted. No guarding or rebound. No masses appreciated.  Rectal:  Deferred  Msk:  Symmetrical without gross deformities. Normal posture. Pulses:  Normal pulses noted. Extremities:  Without clubbing or edema. Neurologic:  Alert and  oriented x4;  grossly normal neurologically. Skin:  Intact without significant lesions or rashes. Cervical Nodes:  No significant cervical adenopathy. Psych:  Alert and cooperative. Normal mood and affect.

## 2020-10-30 ENCOUNTER — Ambulatory Visit: Payer: Medicaid Other | Admitting: Gastroenterology

## 2020-10-30 ENCOUNTER — Encounter: Payer: Self-pay | Admitting: Gastroenterology

## 2021-09-12 ENCOUNTER — Emergency Department (HOSPITAL_COMMUNITY)
Admission: EM | Admit: 2021-09-12 | Discharge: 2021-09-12 | Disposition: A | Discharge status: Home / Self Care | Payer: Medicaid Other | Attending: Emergency Medicine | Admitting: Emergency Medicine

## 2021-09-12 ENCOUNTER — Emergency Department (HOSPITAL_COMMUNITY): Payer: Medicaid Other

## 2021-09-12 ENCOUNTER — Encounter (HOSPITAL_COMMUNITY): Payer: Self-pay

## 2021-09-12 DIAGNOSIS — E119 Type 2 diabetes mellitus without complications: Secondary | ICD-10-CM | POA: Insufficient documentation

## 2021-09-12 DIAGNOSIS — E876 Hypokalemia: Secondary | ICD-10-CM | POA: Diagnosis not present

## 2021-09-12 DIAGNOSIS — Z7984 Long term (current) use of oral hypoglycemic drugs: Secondary | ICD-10-CM | POA: Insufficient documentation

## 2021-09-12 DIAGNOSIS — R079 Chest pain, unspecified: Secondary | ICD-10-CM | POA: Insufficient documentation

## 2021-09-12 DIAGNOSIS — Z794 Long term (current) use of insulin: Secondary | ICD-10-CM | POA: Diagnosis not present

## 2021-09-12 LAB — CBC
HCT: 41.5 % (ref 39.0–52.0)
Hemoglobin: 14.4 g/dL (ref 13.0–17.0)
MCH: 29.6 pg (ref 26.0–34.0)
MCHC: 34.7 g/dL (ref 30.0–36.0)
MCV: 85.4 fL (ref 80.0–100.0)
Platelets: 333 10*3/uL (ref 150–400)
RBC: 4.86 MIL/uL (ref 4.22–5.81)
RDW: 12.9 % (ref 11.5–15.5)
WBC: 14.5 10*3/uL — ABNORMAL HIGH (ref 4.0–10.5)
nRBC: 0 % (ref 0.0–0.2)

## 2021-09-12 LAB — MAGNESIUM: Magnesium: 2 mg/dL (ref 1.7–2.4)

## 2021-09-12 LAB — BASIC METABOLIC PANEL
Anion gap: 8 (ref 5–15)
BUN: 16 mg/dL (ref 6–20)
CO2: 27 mmol/L (ref 22–32)
Calcium: 8.8 mg/dL — ABNORMAL LOW (ref 8.9–10.3)
Chloride: 103 mmol/L (ref 98–111)
Creatinine, Ser: 2.12 mg/dL — ABNORMAL HIGH (ref 0.61–1.24)
GFR, Estimated: 38 mL/min — ABNORMAL LOW (ref >60)
Glucose, Bld: 273 mg/dL — ABNORMAL HIGH (ref 70–99)
Potassium: 3.2 mmol/L — ABNORMAL LOW (ref 3.5–5.1)
Sodium: 138 mmol/L (ref 135–145)

## 2021-09-12 LAB — TROPONIN I (HIGH SENSITIVITY)
Troponin I (High Sensitivity): 14 ng/L (ref <18)
Troponin I (High Sensitivity): 16 ng/L (ref <18)

## 2021-09-12 MED ORDER — POTASSIUM CHLORIDE CRYS ER 20 MEQ PO TBCR: 20.0000 meq | EXTENDED_RELEASE_TABLET | Freq: Every day | ORAL | 0 refills | Status: AC

## 2021-09-12 MED ORDER — MAGNESIUM SULFATE 2 GM/50ML IV SOLN
2.0000 g | Freq: Once | INTRAVENOUS | Status: AC
  Administered 2021-09-12: 2 g via INTRAVENOUS
  Filled 2021-09-12: qty 50

## 2021-09-12 MED ORDER — MAGNESIUM OXIDE -MG SUPPLEMENT 200 MG PO TABS: 1.0000 | ORAL_TABLET | Freq: Two times a day (BID) | ORAL | 0 refills | Status: AC

## 2021-09-12 MED ORDER — POTASSIUM CHLORIDE CRYS ER 20 MEQ PO TBCR
40.0000 meq | EXTENDED_RELEASE_TABLET | Freq: Once | ORAL | Status: AC
  Administered 2021-09-12: 40 meq via ORAL
  Filled 2021-09-12: qty 2

## 2021-09-12 NOTE — Discharge Instructions (Signed)
Testing does not show any serious problems.  We are going to treat you with potassium magnesium, prescriptions were sent to your pharmacy.  Make sure you are getting plenty of rest and drinking more fluids.  Avoid using cocaine.  Take all of your medicines as prescribed.  See your doctor for checkup next week

## 2021-09-12 NOTE — ED Triage Notes (Addendum)
Pt states he is having left sided chest pain and palpitations since yesterday- pt describes as pinching feeling. Pt states he has been "shocked" back into rhythm in the past. Pt admits to cocaine use a few days ago.

## 2021-09-12 NOTE — ED Provider Notes (Signed)
[facility] EMERGENCY DEPARTMENT Provider Note   CSN: [id number] Arrival date & time: 09/12/21  1818     History {Add pertinent medical, surgical, social history, OB history to HPI:1} Chief Complaint  Patient presents with   Chest Pain    Christopher Chambers is a 47 y.o. male.  HPI Presents for evaluation of palpitations for several days.  He admits using cocaine 2 days ago.  He has occasional sensation of shortness of breath.  He denies fever, chills, cough, nausea or vomiting.  He is here with his wife.    Home Medications Prior to Admission medications   Medication Sig Start Date End Date Taking? Authorizing Provider  acetaminophen (TYLENOL) 500 MG tablet Take 1,000 mg by mouth every 6 (six) hours as needed for mild pain.    [provider]  Carboxymethylcellul-Glycerin (LUBRICATING EYE DROPS OP) Place 1 drop into both eyes daily as needed (dry eyes).    [provider]  diclofenac Sodium (VOLTAREN) 1 % GEL Apply 1 application topically 4 (four) times daily as needed for pain. 09/03/19   [provider]  diltiazem (CARDIZEM CD) 180 MG 24 hr capsule Take 1 capsule (180 mg total) by mouth daily. Patient not taking: Reported on 01/04/2020 10/08/13   Nita Sells, MD  diltiazem (CARDIZEM CD) 360 MG 24 hr capsule Take 360 mg by mouth daily. 07/19/19   [provider]  empagliflozin (JARDIANCE) 10 MG TABS tablet Take 10 mg by mouth daily.    [provider]  glipiZIDE (GLUCOTROL) 10 MG tablet Take 10 mg by mouth 2 (two) times daily.  07/23/16   [provider]  insulin lispro (HUMALOG) 100 UNIT/ML injection Inject 15 Units into the skin 3 (three) times daily before meals.    [provider]  LANTUS 100 UNIT/ML injection Inject 40 Units into the skin at bedtime.  03/04/18   [provider]  losartan (COZAAR) 25 MG tablet Take 25 mg by mouth daily.    [provider]  naloxone [facility]) 4 MG/0.1ML LIQD nasal  spray kit Place 1 spray into the nose as needed (opioid overdose).    [provider]  oxyCODONE-acetaminophen (PERCOCET) 10-325 MG tablet Take 1 tablet by mouth every 6 (six) hours as needed for pain.  02/09/19   [provider]  pantoprazole (PROTONIX) 40 MG tablet Take 40 mg by mouth daily.  01/07/18   [provider]  tadalafil (CIALIS) 5 MG tablet Take 5 mg by mouth daily as needed for erectile dysfunction. 12/01/19   [provider]  VICTOZA 18 MG/3ML SOPN Inject 1.2 mg into the skin daily. 12/01/19   [provider]      Allergies    Metformin and related    Review of Systems   Review of Systems  Physical Exam Updated Vital Signs BP (!) 171/112   Pulse 78   Temp 98 F (36.7 C) (Oral)   Resp 15   Ht '6\' 2"'  (1.88 m)   Wt 134.3 kg   SpO2 98%   BMI 38.00 kg/m  Physical Exam Vitals and nursing note reviewed.  Constitutional:      Appearance: He is well-developed. He is not ill-appearing.  HENT:     Head: Normocephalic and atraumatic.     Right Ear: External ear normal.     Left Ear: External ear normal.  Eyes:     Conjunctiva/sclera: Conjunctivae normal.     Pupils: Pupils are equal, round, and reactive to light.  Neck:  Trachea: Phonation normal.  Cardiovascular:     Rate and Rhythm: Normal rate and regular rhythm.     Heart sounds: Normal heart sounds.  Pulmonary:     Effort: Pulmonary effort is normal. No respiratory distress.     Breath sounds: Normal breath sounds. No stridor. No wheezing or rhonchi.  Abdominal:     Palpations: Abdomen is soft.     Tenderness: There is no abdominal tenderness.  Musculoskeletal:        General: Normal range of motion.     Cervical back: Normal range of motion and neck supple.  Skin:    General: Skin is warm and dry.  Neurological:     Mental Status: He is alert and oriented to person, place, and time.     Cranial Nerves: No cranial nerve deficit.     Sensory: No sensory deficit.      Motor: No abnormal muscle tone.     Coordination: Coordination normal.  Psychiatric:        Mood and Affect: Mood normal.        Behavior: Behavior normal.        Thought Content: Thought content normal.        Judgment: Judgment normal.     ED Results / Procedures / Treatments   Labs (all labs ordered are listed, but only abnormal results are displayed) Labs Reviewed  BASIC METABOLIC PANEL - Abnormal; Notable for the following components:      Result Value   Potassium 3.2 (*)    Glucose, Bld 273 (*)    Creatinine, Ser 2.12 (*)    Calcium 8.8 (*)    GFR, Estimated 38 (*)    All other components within normal limits  CBC - Abnormal; Notable for the following components:   WBC 14.5 (*)    All other components within normal limits  TROPONIN I (HIGH SENSITIVITY)  TROPONIN I (HIGH SENSITIVITY)    EKG None  Radiology DG Chest 2 View  Result Date: 09/12/2021 CLINICAL DATA:  Chest pain EXAM: CHEST - 2 VIEW COMPARISON:  10/06/2013 FINDINGS: Cardiac and mediastinal contours are within normal limits. No focal pulmonary opacity. No pleural effusion or pneumothorax. No acute osseous abnormality. Degenerative changes in the thoracic spine. IMPRESSION: No acute cardiopulmonary process. Electronically Signed   By: Merilyn Baba M.D.   On: 09/12/2021 18:49    Procedures Procedures  {Document cardiac monitor, telemetry assessment procedure when appropriate:1}  Medications Ordered in ED Medications - No data to display  ED Course/ Medical Decision Making/ A&P                           Medical Decision Making Amount and/or Complexity of Data Reviewed Labs: ordered. Radiology: ordered.   ***  {Document critical care time when appropriate:1} {Document review of labs and clinical decision tools ie heart score, Chads2Vasc2 etc:1}  {Document your independent review of radiology images, and any outside records:1} {Document your discussion with family members, caretakers, and with  consultants:1} {Document social determinants of health affecting pt's care:1} {Document your decision making why or why not admission, treatments were needed:1} Final Clinical Impression(s) / ED Diagnoses Final diagnoses:  None    Rx / DC Orders ED Discharge Orders     None

## 2022-02-21 ENCOUNTER — Encounter: Payer: Self-pay | Admitting: *Deleted

## 2022-08-05 ENCOUNTER — Encounter: Payer: Self-pay | Admitting: *Deleted

## 2022-08-20 ENCOUNTER — Encounter (INDEPENDENT_AMBULATORY_CARE_PROVIDER_SITE_OTHER): Payer: Self-pay | Admitting: *Deleted

## 2023-02-20 ENCOUNTER — Encounter (INDEPENDENT_AMBULATORY_CARE_PROVIDER_SITE_OTHER): Payer: Self-pay | Admitting: *Deleted

## 2024-02-17 ENCOUNTER — Encounter (INDEPENDENT_AMBULATORY_CARE_PROVIDER_SITE_OTHER): Payer: Self-pay | Admitting: *Deleted

## 2024-02-24 ENCOUNTER — Other Ambulatory Visit (HOSPITAL_COMMUNITY)
Admission: RE | Admit: 2024-02-24 | Discharge: 2024-02-24 | Disposition: A | Discharge status: Home / Self Care | Source: Ambulatory Visit | Attending: Nephrology | Admitting: Nephrology

## 2024-02-24 DIAGNOSIS — N185 Chronic kidney disease, stage 5: Secondary | ICD-10-CM | POA: Diagnosis present

## 2024-02-24 DIAGNOSIS — E211 Secondary hyperparathyroidism, not elsewhere classified: Secondary | ICD-10-CM | POA: Insufficient documentation

## 2024-02-24 DIAGNOSIS — E559 Vitamin D deficiency, unspecified: Secondary | ICD-10-CM | POA: Diagnosis present

## 2024-02-24 DIAGNOSIS — D7589 Other specified diseases of blood and blood-forming organs: Secondary | ICD-10-CM | POA: Diagnosis present

## 2024-02-24 DIAGNOSIS — N189 Chronic kidney disease, unspecified: Secondary | ICD-10-CM | POA: Diagnosis present

## 2024-02-24 DIAGNOSIS — E119 Type 2 diabetes mellitus without complications: Secondary | ICD-10-CM | POA: Insufficient documentation

## 2024-02-24 DIAGNOSIS — D649 Anemia, unspecified: Secondary | ICD-10-CM | POA: Diagnosis present

## 2024-02-24 DIAGNOSIS — I10 Essential (primary) hypertension: Secondary | ICD-10-CM | POA: Diagnosis present

## 2024-02-24 DIAGNOSIS — R809 Proteinuria, unspecified: Secondary | ICD-10-CM | POA: Insufficient documentation

## 2024-02-24 DIAGNOSIS — D539 Nutritional anemia, unspecified: Secondary | ICD-10-CM | POA: Diagnosis present

## 2024-02-24 DIAGNOSIS — N184 Chronic kidney disease, stage 4 (severe): Secondary | ICD-10-CM | POA: Diagnosis present

## 2024-02-24 DIAGNOSIS — D631 Anemia in chronic kidney disease: Secondary | ICD-10-CM | POA: Insufficient documentation

## 2024-02-24 LAB — CBC
HCT: 35.4 % — ABNORMAL LOW (ref 39.0–52.0)
Hemoglobin: 11.1 g/dL — ABNORMAL LOW (ref 13.0–17.0)
MCH: 27.5 pg (ref 26.0–34.0)
MCHC: 31.4 g/dL (ref 30.0–36.0)
MCV: 87.6 fL (ref 80.0–100.0)
Platelets: 299 K/uL (ref 150–400)
RBC: 4.04 MIL/uL — ABNORMAL LOW (ref 4.22–5.81)
RDW: 14.3 % (ref 11.5–15.5)
WBC: 11 K/uL — ABNORMAL HIGH (ref 4.0–10.5)
nRBC: 0 % (ref 0.0–0.2)

## 2024-02-24 LAB — IRON AND TIBC
Iron: 28 ug/dL — ABNORMAL LOW (ref 45–182)
Saturation Ratios: 10 % — ABNORMAL LOW (ref 17.9–39.5)
TIBC: 280 ug/dL (ref 250–450)
UIBC: 252 ug/dL

## 2024-02-24 LAB — VITAMIN D 25 HYDROXY (VIT D DEFICIENCY, FRACTURES): Vit D, 25-Hydroxy: 19 ng/mL — ABNORMAL LOW (ref 30–100)

## 2024-02-24 LAB — RENAL FUNCTION PANEL
Albumin: 3.4 g/dL — ABNORMAL LOW (ref 3.5–5.0)
Anion gap: 14 (ref 5–15)
BUN: 63 mg/dL — ABNORMAL HIGH (ref 6–20)
CO2: 21 mmol/L — ABNORMAL LOW (ref 22–32)
Calcium: 7.6 mg/dL — ABNORMAL LOW (ref 8.9–10.3)
Chloride: 107 mmol/L (ref 98–111)
Creatinine, Ser: 5.48 mg/dL — ABNORMAL HIGH (ref 0.61–1.24)
GFR, Estimated: 12 mL/min — ABNORMAL LOW
Glucose, Bld: 87 mg/dL (ref 70–99)
Phosphorus: 5.6 mg/dL — ABNORMAL HIGH (ref 2.5–4.6)
Potassium: 5.2 mmol/L — ABNORMAL HIGH (ref 3.5–5.1)
Sodium: 142 mmol/L (ref 135–145)

## 2024-02-24 LAB — FOLATE: Folate: 6 ng/mL

## 2024-02-24 LAB — HEPATITIS B SURFACE ANTIGEN: Hepatitis B Surface Ag: NONREACTIVE

## 2024-02-24 LAB — HEPATITIS B CORE ANTIBODY, IGM: Hep B C IgM: NONREACTIVE

## 2024-02-24 LAB — FERRITIN: Ferritin: 118 ng/mL (ref 24–336)

## 2024-02-24 LAB — VITAMIN B12: Vitamin B-12: 938 pg/mL — ABNORMAL HIGH (ref 180–914)

## 2024-02-25 LAB — HCV AB W REFLEX TO QUANT PCR: HCV Ab: NONREACTIVE

## 2024-02-25 LAB — HCV INTERPRETATION

## 2024-02-25 LAB — PARATHYROID HORMONE, INTACT (NO CA): PTH: 152 pg/mL — ABNORMAL HIGH (ref 15–65)

## 2024-02-25 LAB — HEPATITIS B SURFACE ANTIBODY, QUANTITATIVE: Hep B S AB Quant (Post): <3.5 m[IU]/mL — ABNORMAL LOW

## 2024-02-25 LAB — MICROALBUMIN / CREATININE URINE RATIO
Creatinine, Urine: 95.4 mg/dL
Microalb Creat Ratio: 2977 mg/g{creat} — ABNORMAL HIGH (ref 0–29)
Microalb, Ur: 2840.4 ug/mL — ABNORMAL HIGH

## 2024-02-25 LAB — HEPATITIS B CORE ANTIBODY, TOTAL: HEP B CORE AB: NEGATIVE

## 2024-02-26 LAB — QUANTIFERON-TB GOLD PLUS (RQFGPL)
QuantiFERON Mitogen Value: >10 [IU]/mL
QuantiFERON Nil Value: 0.04 [IU]/mL
QuantiFERON TB1 Ag Value: 0.04 [IU]/mL
QuantiFERON TB2 Ag Value: 0.04 [IU]/mL

## 2024-02-26 LAB — QUANTIFERON-TB GOLD PLUS: QuantiFERON-TB Gold Plus: NEGATIVE

## 2024-03-04 ENCOUNTER — Emergency Department (HOSPITAL_COMMUNITY)

## 2024-03-04 ENCOUNTER — Emergency Department (HOSPITAL_COMMUNITY): Admission: EM | Admit: 2024-03-04 | Discharge: 2024-03-04 | Disposition: A | Discharge status: Home / Self Care

## 2024-03-04 ENCOUNTER — Other Ambulatory Visit: Payer: Self-pay

## 2024-03-04 ENCOUNTER — Encounter (HOSPITAL_COMMUNITY): Payer: Self-pay

## 2024-03-04 DIAGNOSIS — G8929 Other chronic pain: Secondary | ICD-10-CM | POA: Diagnosis not present

## 2024-03-04 DIAGNOSIS — Z7984 Long term (current) use of oral hypoglycemic drugs: Secondary | ICD-10-CM | POA: Diagnosis not present

## 2024-03-04 DIAGNOSIS — M25561 Pain in right knee: Secondary | ICD-10-CM | POA: Diagnosis present

## 2024-03-04 DIAGNOSIS — Z794 Long term (current) use of insulin: Secondary | ICD-10-CM | POA: Insufficient documentation

## 2024-03-04 DIAGNOSIS — Z79899 Other long term (current) drug therapy: Secondary | ICD-10-CM | POA: Insufficient documentation

## 2024-03-04 DIAGNOSIS — M7989 Other specified soft tissue disorders: Secondary | ICD-10-CM | POA: Insufficient documentation

## 2024-03-04 MED ORDER — METHOCARBAMOL 500 MG PO TABS: 500.0000 mg | ORAL_TABLET | Freq: Four times a day (QID) | ORAL | 0 refills | Status: AC | PRN

## 2024-03-04 MED ORDER — LIDOCAINE 5 % EX PTCH
1.0000 | MEDICATED_PATCH | CUTANEOUS | Status: DC
  Administered 2024-03-04: 1 via TRANSDERMAL
  Filled 2024-03-04: qty 1

## 2024-03-04 MED ORDER — LIDOCAINE 5 % EX PTCH: 1.0000 | MEDICATED_PATCH | CUTANEOUS | 0 refills | Status: AC

## 2024-03-04 MED ORDER — METHOCARBAMOL 500 MG PO TABS
1000.0000 mg | ORAL_TABLET | Freq: Once | ORAL | Status: AC
  Administered 2024-03-04: 1000 mg via ORAL
  Filled 2024-03-04: qty 2

## 2024-03-04 NOTE — Discharge Instructions (Signed)
 Take Tylenol  and your muscle relaxer as needed for pain.  Use lidocaine  patches daily as needed.  Wear a brace or Ace wrap on your knee to help with compression and swelling.  Follow-up with orthopedic surgery.

## 2024-03-04 NOTE — ED Triage Notes (Signed)
 Pt states he usually has bilateral knee pain but over the past week it has gotten worse. States he can't put weight. Reports bilateral knee swelling too.

## 2024-03-04 NOTE — ED Provider Notes (Signed)
 " [place] EMERGENCY DEPARTMENT AT [facility] Provider Note   CSN: [id number] Arrival date & time: 03/04/24  2106     Patient presents with: Knee Pain   Christopher Chambers is a 49 y.o. male.   49 year old male presents for evaluation of chronic right knee pain.  States has been worse last few days.  Denies falls or injury.  States he is taking Tylenol  at home without relief.  Denies any other symptoms or concerns at this time.   Knee Pain Associated symptoms: no back pain and no fever        Prior to Admission medications  Medication Sig Start Date End Date Taking? Authorizing Provider  lidocaine  (LIDODERM ) 5 % Place 1 patch onto the skin daily. Remove & Discard patch within 12 hours or as directed by MD 03/04/24  Yes Aarush Stukey L, DO  methocarbamol  (ROBAXIN ) 500 MG tablet Take 1 tablet (500 mg total) by mouth every 6 (six) hours as needed for up to 7 days. 03/04/24 03/11/24 Yes Tais Koestner L, DO  acetaminophen  (TYLENOL ) 500 MG tablet Take 1,000 mg by mouth every 6 (six) hours as needed for mild pain.    [provider]  Carboxymethylcellul-Glycerin (LUBRICATING EYE DROPS OP) Place 1 drop into both eyes daily as needed (dry eyes).    [provider]  diclofenac Sodium (VOLTAREN) 1 % GEL Apply 1 application topically 4 (four) times daily as needed for pain. 09/03/19   [provider]  diltiazem  (CARDIZEM  CD) 180 MG 24 hr capsule Take 1 capsule (180 mg total) by mouth daily. Patient not taking: Reported on 01/04/2020 10/08/13   Samtani, Jai-Gurmukh, MD  diltiazem  (CARDIZEM  CD) 360 MG 24 hr capsule Take 360 mg by mouth daily. 07/19/19   [provider]  empagliflozin (JARDIANCE) 10 MG TABS tablet Take 10 mg by mouth daily.    [provider]  glipiZIDE (GLUCOTROL) 10 MG tablet Take 10 mg by mouth 2 (two) times daily.  07/23/16   [provider]  insulin  lispro (HUMALOG) 100 UNIT/ML injection Inject 15 Units into the skin 3  (three) times daily before meals.    [provider]  LANTUS  100 UNIT/ML injection Inject 40 Units into the skin at bedtime.  03/04/18   [provider]  losartan (COZAAR) 25 MG tablet Take 25 mg by mouth daily.    [provider]  Magnesium  Oxide -Mg Supplement 200 MG TABS Take 1 tablet (200 mg total) by mouth 2 (two) times daily. 09/12/21   Lorriane Holmes, MD  naloxone (NARCAN) 4 MG/0.1ML LIQD nasal spray kit Place 1 spray into the nose as needed (opioid overdose).    [provider]  oxyCODONE -acetaminophen  (PERCOCET) 10-325 MG tablet Take 1 tablet by mouth every 6 (six) hours as needed for pain.  02/09/19   [provider]  pantoprazole (PROTONIX) 40 MG tablet Take 40 mg by mouth daily.  01/07/18   [provider]  potassium chloride  SA (KLOR-CON  M) 20 MEQ tablet Take 1 tablet (20 mEq total) by mouth daily. 09/12/21   Lorriane Holmes, MD  tadalafil (CIALIS) 5 MG tablet Take 5 mg by mouth daily as needed for erectile dysfunction. 12/01/19   [provider]  VICTOZA 18 MG/3ML SOPN Inject 1.2 mg into the skin daily. 12/01/19   [provider]    Allergies: Metformin and related    Review of Systems  Constitutional:  Negative for chills and fever.  HENT:  Negative for ear pain  and sore throat.   Eyes:  Negative for pain and visual disturbance.  Respiratory:  Negative for cough and shortness of breath.   Cardiovascular:  Negative for chest pain and palpitations.  Gastrointestinal:  Negative for abdominal pain and vomiting.  Genitourinary:  Negative for dysuria and hematuria.  Musculoskeletal:  Negative for arthralgias and back pain.       Admits right knee pain  Skin:  Negative for color change and rash.  Neurological:  Negative for seizures and syncope.  All other systems reviewed and are negative.   Updated Vital Signs BP (!) 188/110   Pulse 92   Temp 98.3 F (36.8 C) (Oral)   Resp 18   Ht 6' 2 (1.88 m)   Wt (!)  172.4 kg   SpO2 100%   BMI 48.79 kg/m   Physical Exam Vitals and nursing note reviewed.  Constitutional:      General: He is not in acute distress.    Appearance: Normal appearance. He is well-developed. He is obese. He is not ill-appearing.  HENT:     Head: Normocephalic and atraumatic.  Eyes:     Conjunctiva/sclera: Conjunctivae normal.  Cardiovascular:     Rate and Rhythm: Normal rate and regular rhythm.     Heart sounds: No murmur heard. Pulmonary:     Effort: Pulmonary effort is normal. No respiratory distress.     Breath sounds: Normal breath sounds.  Abdominal:     Palpations: Abdomen is soft.     Tenderness: There is no abdominal tenderness.  Musculoskeletal:        General: Swelling present.     Cervical back: Neck supple.     Comments: Right knee with generlized tenderness to palpation over the bilateral joint lines, no deformity, mild swelling   Skin:    General: Skin is warm and dry.     Capillary Refill: Capillary refill takes less than 2 seconds.  Neurological:     Mental Status: He is alert.  Psychiatric:        Mood and Affect: Mood normal.     (all labs ordered are listed, but only abnormal results are displayed) Labs Reviewed - No data to display  EKG: None  Radiology: DG Knee Complete 4 Views Right Result Date: 03/04/2024 EXAM: 4 VIEW(S) XRAY OF THE RIGHT KNEE 03/04/2024 09:55:00 PM COMPARISON: None available. CLINICAL HISTORY: Pain. FINDINGS: BONES AND JOINTS: No acute fracture. Subchondral cystic changes most severe at the lateral compartment. Tricompartmental joint space narrowing, sclerosis and osteophytes consistent with degenerative joint disease. SOFT TISSUES: Unremarkable. IMPRESSION: 1. 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 Electronically signed by: Oneil Devonshire MD 03/04/2024 10:08 PM EST RP Workstation: [id number]     Procedures   Medications Ordered in the ED   methocarbamol  (ROBAXIN ) tablet 1,000 mg (has no administration in time range)  lidocaine  (LIDODERM ) 5 % 1 patch (has no administration in time range)                                    Medical Decision Making Patient here for chronic right knee pain.  He has severe degenerative changes on his x-ray consistent with arthritis.  Will give prescription for Robaxin  and lidocaine  patches.  He was given that here and given a knee brace as well.  Advised to continue Tylenol  as needed and obtain close follow-up with primary care and  his orthopedic surgeon.  Advised return for new or worsening symptoms.  He feels comfortable being discharged home.  Problems Addressed: Chronic pain of right knee: chronic illness or injury with exacerbation, progression, or side effects of treatment  Amount and/or Complexity of Data Reviewed External Data Reviewed: notes.    Details: Prior ED records reviewed and patient seen 02-19-2024 for back pain Radiology: ordered and independent interpretation performed. Decision-making details documented in ED Course.    Details: Ordered and interpreted by me independently of radiology Right knee x-ray: Shows severe degenerative changes and arthritis  Risk OTC drugs. Prescription drug management.     Final diagnoses:  Chronic pain of right knee    ED Discharge Orders          Ordered    methocarbamol  (ROBAXIN ) 500 MG tablet  Every 6 hours PRN        03/04/24 2251    lidocaine  (LIDODERM ) 5 %  Every 24 hours        03/04/24 2251               Gennaro Duwaine CROME, DO 03/04/24 2254  "
